# Patient Record
Sex: Female | Born: 1990
Health system: Southern US, Community
[De-identification: ages and names within clinical notes are randomized; demographics above are authoritative.]

## PROBLEM LIST (undated history)

## (undated) ENCOUNTER — Inpatient Hospital Stay (HOSPITAL_COMMUNITY): Payer: Self-pay

## (undated) DIAGNOSIS — O139 Gestational [pregnancy-induced] hypertension without significant proteinuria, unspecified trimester: Secondary | ICD-10-CM

## (undated) DIAGNOSIS — E876 Hypokalemia: Secondary | ICD-10-CM

## (undated) HISTORY — PX: KNEE SURGERY: SHX244

## (undated) HISTORY — DX: Gestational (pregnancy-induced) hypertension without significant proteinuria, unspecified trimester: O13.9

## (undated) HISTORY — PX: TONSILLECTOMY: SUR1361

## (undated) HISTORY — DX: Hypokalemia: E87.6

---

## 2012-10-21 LAB — OB RESULTS CONSOLE HEPATITIS B SURFACE ANTIGEN: Hepatitis B Surface Ag: NEGATIVE

## 2012-10-21 LAB — OB RESULTS CONSOLE RUBELLA ANTIBODY, IGM: Rubella: IMMUNE

## 2012-10-21 LAB — OB RESULTS CONSOLE ABO/RH: RH Type: POSITIVE

## 2012-10-21 LAB — OB RESULTS CONSOLE ANTIBODY SCREEN: Antibody Screen: NEGATIVE

## 2012-10-21 LAB — OB RESULTS CONSOLE HGB/HCT, BLOOD: Hemoglobin: 12.3 g/dL

## 2012-12-16 NOTE — L&D Delivery Note (Signed)
Delivery Note At 8:53 PM a viable female was delivered via Vaginal, Spontaneous Delivery (Presentation: ; Right Occiput Anterior).  APGAR: 7, 9; weight: not available at time of note  .   Placenta status: Intact, Spontaneous.  Cord: 3 vessels with the following complications: None.  Cord pH: not done  Anesthesia: Epidural  Episiotomy: n/a Lacerations: 1st degree Rt labial/periurethral Suture Repair: 4.0 monocryl Est. Blood Loss (mL): 300  Mom to AICU for 24hrs pp magnesium.  Baby to nursery-stable. Breastfeeding, undecided about contraception, OP circumcision.  Marge Duncans 05/01/2013, 9:28 PM

## 2012-12-16 NOTE — L&D Delivery Note (Signed)
Agree with above note.  Rikki Trosper H. 05/11/2013 11:07 AM

## 2013-02-08 LAB — GLUCOSE, 1 HOUR GESTATIONAL: Glucose, 1 hour: 119

## 2013-02-08 LAB — OB RESULTS CONSOLE HGB/HCT, BLOOD: Hemoglobin: 11.5 g/dL

## 2013-04-05 LAB — OB RESULTS CONSOLE GC/CHLAMYDIA: Chlamydia: NEGATIVE

## 2013-04-09 LAB — OB RESULTS CONSOLE GBS: GBS: POSITIVE

## 2013-04-26 ENCOUNTER — Inpatient Hospital Stay (HOSPITAL_COMMUNITY): Payer: Medicaid Other

## 2013-04-26 ENCOUNTER — Inpatient Hospital Stay (HOSPITAL_COMMUNITY)
Admission: AD | Admit: 2013-04-26 | Discharge: 2013-04-26 | Disposition: A | Discharge status: Home / Self Care | Payer: Medicaid Other | Source: Ambulatory Visit | Attending: Obstetrics & Gynecology | Admitting: Obstetrics & Gynecology

## 2013-04-26 ENCOUNTER — Encounter (HOSPITAL_COMMUNITY): Payer: Self-pay | Admitting: *Deleted

## 2013-04-26 DIAGNOSIS — O139 Gestational [pregnancy-induced] hypertension without significant proteinuria, unspecified trimester: Secondary | ICD-10-CM | POA: Insufficient documentation

## 2013-04-26 DIAGNOSIS — O368190 Decreased fetal movements, unspecified trimester, not applicable or unspecified: Secondary | ICD-10-CM | POA: Insufficient documentation

## 2013-04-26 DIAGNOSIS — O1203 Gestational edema, third trimester: Secondary | ICD-10-CM

## 2013-04-26 LAB — RAPID URINE DRUG SCREEN, HOSP PERFORMED
Barbiturates: NOT DETECTED
Benzodiazepines: NOT DETECTED
Cocaine: NOT DETECTED

## 2013-04-26 LAB — COMPREHENSIVE METABOLIC PANEL
BUN: 5 mg/dL — ABNORMAL LOW (ref 6–23)
CO2: 22 mEq/L (ref 19–32)
Chloride: 103 mEq/L (ref 96–112)
Creatinine, Ser: 0.68 mg/dL (ref 0.50–1.10)
GFR calc Af Amer: >90 mL/min (ref >90)
GFR calc non Af Amer: >90 mL/min (ref >90)
Glucose, Bld: 73 mg/dL (ref 70–99)
Total Bilirubin: 0.2 mg/dL — ABNORMAL LOW (ref 0.3–1.2)

## 2013-04-26 LAB — CBC
HCT: 33.7 % — ABNORMAL LOW (ref 36.0–46.0)
MCV: 88 fL (ref 78.0–100.0)
RDW: 12.9 % (ref 11.5–15.5)
WBC: 11.3 10*3/uL — ABNORMAL HIGH (ref 4.0–10.5)

## 2013-04-26 LAB — URINALYSIS, ROUTINE W REFLEX MICROSCOPIC
Bilirubin Urine: NEGATIVE
Hgb urine dipstick: NEGATIVE
Nitrite: NEGATIVE
Specific Gravity, Urine: <1.005 — ABNORMAL LOW (ref 1.005–1.030)
pH: 7 (ref 5.0–8.0)

## 2013-04-26 LAB — URINE MICROSCOPIC-ADD ON

## 2013-04-26 MED ORDER — POTASSIUM CHLORIDE CRYS ER 20 MEQ PO TBCR: 40.0000 meq | EXTENDED_RELEASE_TABLET | Freq: Once | ORAL | Status: DC

## 2013-04-26 MED ORDER — POTASSIUM CHLORIDE CRYS ER 20 MEQ PO TBCR
40.0000 meq | EXTENDED_RELEASE_TABLET | Freq: Once | ORAL | Status: AC
  Administered 2013-04-26: 40 meq via ORAL
  Filled 2013-04-26: qty 2

## 2013-04-26 NOTE — MAU Note (Signed)
Pt states she hasn't felt the baby move but two times today.  Pt called [facility] and spoke with Patty, RN and was advised by Dr. Allena Katz to come to the hospital since she was miserable.  Denies vaginal bleeding or ROM.

## 2013-04-26 NOTE — MAU Provider Note (Signed)
History     CSN: [id number]  Arrival date and time: 04/26/13 1807   First Provider Initiated Contact with Patient 04/26/13 1859      Chief Complaint  Patient presents with  . Decreased Fetal Movement   HPI This is a .22 y.o. female at [redacted]w[redacted]d who presents with c/o swelling and decreased fetal movement. Wants to change care to our clinic here because she did not like [facility] when she toured there. States has had high blood pressure  Most recent B/P at the office per records was 140/80  RN Note: Pt states she hasn't felt the baby move but two times today. Pt called [facility] and spoke with Patty, RN and was advised by Dr. Allena Katz to come to the hospital since she was miserable. Denies vaginal bleeding or ROM.      OB History   Grav Para Term Preterm Abortions TAB SAB Ect Mult Living   1         0      Past Medical History  Diagnosis Date  . Medical history non-contributory     Past Surgical History  Procedure Laterality Date  . Tonsillectomy    . Knee surgery      History reviewed. No pertinent family history.  History  Substance Use Topics  . Smoking status: Never Smoker   . Smokeless tobacco: Not on file  . Alcohol Use: No     Comment: stopped 1 1/2 months ago, would smoke 1-2 times a day    Allergies:  Allergies  Allergen Reactions  . Ultram (Tramadol)     Unknown reaction was rushed to ER when she took tramadol    Prescriptions prior to admission  Medication Sig Dispense Refill  . acetaminophen (TYLENOL) 325 MG tablet Take 650 mg by mouth daily as needed for pain.      . calcium carbonate (TUMS - DOSED IN MG ELEMENTAL CALCIUM) 500 MG chewable tablet Chew 1 tablet by mouth daily as needed for heartburn.      . Prenatal Vit-Fe Fumarate-FA (PRENATAL MULTIVITAMIN) TABS Take 1 tablet by mouth at bedtime.        Review of Systems  Constitutional: Positive for malaise/fatigue. Negative for fever and chills.  Eyes: Positive for blurred vision.   Gastrointestinal: Positive for abdominal pain. Negative for nausea, vomiting, diarrhea and constipation.  Genitourinary: Negative for dysuria.  Musculoskeletal: Positive for myalgias.  Neurological: Positive for weakness and headaches.   Physical Exam   Blood pressure 123/70, pulse 63, temperature 97.7 F (36.5 C), temperature source Oral, resp. rate 20, height 5\' 11"  (1.803 m), weight 260 lb 8 oz (118.162 kg), SpO2 100.00%. Filed Vitals:   04/26/13 1849 04/26/13 1900 04/26/13 1901 04/26/13 1921  BP: 149/85 145/90 145/96 123/70  Pulse: 74 84 79 63  Temp:      TempSrc:      Resp:      Height:      Weight:      SpO2:        Physical Exam  Constitutional: She is oriented to person, place, and time. She appears well-developed and well-nourished. No distress.  HENT:  Head: Normocephalic.  Neck: Normal range of motion. Neck supple.  Cardiovascular: Normal rate.   Respiratory: Effort normal.  GI: Soft. She exhibits no distension. There is no tenderness. There is no rebound and no guarding.  Genitourinary: Vagina normal and uterus normal. No vaginal discharge found.  Musculoskeletal: Normal range of motion. She exhibits edema (1-2+).  Neurological: She is alert and oriented to person, place, and time.  Skin: Skin is warm and dry.  Psychiatric: She has a normal mood and affect.  Fetal heart rate reactive Occasional mild contractions Dilation: 1 Effacement (%): 60 Station: -3 Presentation: Vertex Exam by:: Artelia Laroche, CNM  Previous exam 1cm MAU Course  Procedures Results for orders placed during the hospital encounter of 04/26/13 (from the past 24 hour(s))  URINALYSIS, ROUTINE W REFLEX MICROSCOPIC     Status: Abnormal   Collection Time    04/26/13  6:11 PM      Result Value Range   Color, Urine YELLOW  YELLOW   APPearance CLEAR  CLEAR   Specific Gravity, Urine <1.005 (*) 1.005 - 1.030   pH 7.0  5.0 - 8.0   Glucose, UA NEGATIVE  NEGATIVE mg/dL   Hgb urine dipstick  NEGATIVE  NEGATIVE   Bilirubin Urine NEGATIVE  NEGATIVE   Ketones, ur NEGATIVE  NEGATIVE mg/dL   Protein, ur NEGATIVE  NEGATIVE mg/dL   Urobilinogen, UA 0.2  0.0 - 1.0 mg/dL   Nitrite NEGATIVE  NEGATIVE   Leukocytes, UA TRACE (*) NEGATIVE  URINE RAPID DRUG SCREEN (HOSP PERFORMED)     Status: None   Collection Time    04/26/13  6:11 PM      Result Value Range   Opiates NONE DETECTED  NONE DETECTED   Cocaine NONE DETECTED  NONE DETECTED   Benzodiazepines NONE DETECTED  NONE DETECTED   Amphetamines NONE DETECTED  NONE DETECTED   Tetrahydrocannabinol NONE DETECTED  NONE DETECTED   Barbiturates NONE DETECTED  NONE DETECTED  PROTEIN / CREATININE RATIO, URINE     Status: Abnormal   Collection Time    04/26/13  6:11 PM      Result Value Range   Creatinine, Urine 29.33     Total Protein, Urine 4.6     PROTEIN CREATININE RATIO 0.16 (*) 0.00 - 0.15  URINE MICROSCOPIC-ADD ON     Status: Abnormal   Collection Time    04/26/13  6:11 PM      Result Value Range   Squamous Epithelial / LPF RARE  RARE   WBC, UA 0-2  <3 WBC/hpf   RBC / HPF 0-2  <3 RBC/hpf   Bacteria, UA FEW (*) RARE  CBC     Status: Abnormal   Collection Time    04/26/13  6:52 PM      Result Value Range   WBC 11.3 (*) 4.0 - 10.5 K/uL   RBC 3.83 (*) 3.87 - 5.11 MIL/uL   Hemoglobin 11.5 (*) 12.0 - 15.0 g/dL   HCT 16.1 (*) 09.6 - 04.5 %   MCV 88.0  78.0 - 100.0 fL   MCH 30.0  26.0 - 34.0 pg   MCHC 34.1  30.0 - 36.0 g/dL   RDW 40.9  81.1 - 91.4 %   Platelets 140 (*) 150 - 400 K/uL  COMPREHENSIVE METABOLIC PANEL     Status: Abnormal   Collection Time    04/26/13  6:52 PM      Result Value Range   Sodium 139  135 - 145 mEq/L   Potassium 2.7 (*) 3.5 - 5.1 mEq/L   Chloride 103  96 - 112 mEq/L   CO2 22  19 - 32 mEq/L   Glucose, Bld 73  70 - 99 mg/dL   BUN 5 (*) 6 - 23 mg/dL   Creatinine, Ser 7.82  0.50 - 1.10 mg/dL   Calcium  9.1  8.4 - 10.5 mg/dL   Total Protein 5.9 (*) 6.0 - 8.3 g/dL   Albumin 2.7 (*) 3.5 - 5.2 g/dL    AST 8  0 - 37 U/L   ALT 5  0 - 35 U/L   Alkaline Phosphatase 168 (*) 39 - 117 U/L   Total Bilirubin 0.2 (*) 0.3 - 1.2 mg/dL   GFR calc non Af Amer >90  >90 mL/min   GFR calc Af Amer >90  >90 mL/min   *RADIOLOGY REPORT*  Clinical Data: Decreased fetal movement.  LIMITED OBSTETRIC ULTRASOUND  Number of Fetuses: 1  Heart Rate: 134 bpm  Movement: Present  Breathing:  Presentation: Cephalic  Placental Location: Fundal, posterior.  Previa: No  Amniotic Fluid (Subjective): Normal  AFI 12.2 cm (5%ile = 7.3 cm; 95%ile = 23.9 cm)  FL: 7.55 cm 38 w 5 d  MATERNAL FINDINGS:  Cervix: N/A  Uterus/Adnexae: Right ovary not visualized. Left ovary normal  appearance. No adnexal masses.  IMPRESSION:  AFI within normal limits.  Fetal heart rate 134 beats.  Recommend followup with non-emergent complete OB 14+ wk US  examination for fetal biometric evaluation and anatomic survey if  not already performed.  Original Report Authenticated By: Charlett Nose, M.D.   2006: Spoke with Dr. Penne Lash: Plan to get an AFI, and send patient with a 24 hour urine. Have her bring the urine back on Wednesday. We will re-check b/p at that time. If it remains elevated we will plan for IOL at 39 weeks.   Assessment and Plan  A:  SIUP at [redacted]w[redacted]d       Gestational hypertension      PNC interruption      R/o preeclampsia P:  Will check PIH labs and Pr/Cr Ratio      Report to [facility] HoganCNM 24 hour urine FU Wednesday in the clinic for B/P check and to drop off 24 hour urine.      [facility] 04/26/2013, 7:52 PM   Tawnya Crook

## 2013-04-26 NOTE — MAU Note (Signed)
Critical lab value rec'd from lab Potassium 2.7.  Artelia Laroche CNM notified of critical value.

## 2013-04-26 NOTE — MAU Note (Signed)
PT   WAS CALLED TO COME BACK SO COULD START 24 HR URINE  TOMORROW AND TAKE TO CLINIC ON WED.  WHILE HERE - DR LEGGETT    WANTS AN U/S ALSO.

## 2013-04-28 ENCOUNTER — Encounter: Payer: Self-pay | Admitting: Advanced Practice Midwife

## 2013-04-28 ENCOUNTER — Ambulatory Visit (INDEPENDENT_AMBULATORY_CARE_PROVIDER_SITE_OTHER): Payer: Medicaid Other | Admitting: Advanced Practice Midwife

## 2013-04-28 VITALS — BP 134/88 | Temp 97.9°F | Wt 261.0 lb

## 2013-04-28 DIAGNOSIS — O139 Gestational [pregnancy-induced] hypertension without significant proteinuria, unspecified trimester: Secondary | ICD-10-CM

## 2013-04-28 DIAGNOSIS — O133 Gestational [pregnancy-induced] hypertension without significant proteinuria, third trimester: Secondary | ICD-10-CM

## 2013-04-28 LAB — POCT URINALYSIS DIP (DEVICE)
Hgb urine dipstick: NEGATIVE
Nitrite: NEGATIVE
Urobilinogen, UA: 0.2 mg/dL (ref 0.0–1.0)
pH: 6 (ref 5.0–8.0)

## 2013-04-28 NOTE — Addendum Note (Signed)
Addended by: Franchot Mimes on: 04/28/2013 01:38 PM   Modules accepted: Orders

## 2013-04-28 NOTE — Progress Notes (Signed)
NST reactive today. Cervix 1/50/-2. C/W Dr. Jolayne Panther. Will continue with plan for IOL on 5/16 2/2 GHTN.

## 2013-04-28 NOTE — Progress Notes (Signed)
Here for first visit. Transferring care from [facility] wants to deliver here, c/o edema" all over" that has gotten worse in last 2 weeks,pelvic pressure. Given new patient information. Brought 24 hour urine with her .

## 2013-04-29 ENCOUNTER — Telehealth (HOSPITAL_COMMUNITY): Payer: Self-pay | Admitting: *Deleted

## 2013-04-29 ENCOUNTER — Encounter (HOSPITAL_COMMUNITY): Payer: Self-pay | Admitting: *Deleted

## 2013-04-29 NOTE — MAU Provider Note (Signed)
Obtained records from [facility].  BP was not > 140/90 on other prenatal visits.  If pt is hypertensive again, would qualify as GHTN due to 2 elevated pressures >4 hrs apart.

## 2013-04-29 NOTE — Telephone Encounter (Signed)
Preadmission screen  

## 2013-04-30 ENCOUNTER — Encounter (HOSPITAL_COMMUNITY): Payer: Self-pay

## 2013-04-30 ENCOUNTER — Inpatient Hospital Stay (HOSPITAL_COMMUNITY)
Admission: RE | Admit: 2013-04-30 | Discharge: 2013-05-05 | DRG: 775 | Disposition: A | Discharge status: Home / Self Care | Payer: Medicaid Other | Source: Ambulatory Visit | Attending: Obstetrics & Gynecology | Admitting: Obstetrics & Gynecology

## 2013-04-30 DIAGNOSIS — O99892 Other specified diseases and conditions complicating childbirth: Secondary | ICD-10-CM | POA: Diagnosis present

## 2013-04-30 DIAGNOSIS — Z22330 Carrier of Group B streptococcus: Secondary | ICD-10-CM

## 2013-04-30 DIAGNOSIS — O09299 Supervision of pregnancy with other poor reproductive or obstetric history, unspecified trimester: Secondary | ICD-10-CM

## 2013-04-30 DIAGNOSIS — O139 Gestational [pregnancy-induced] hypertension without significant proteinuria, unspecified trimester: Principal | ICD-10-CM | POA: Diagnosis present

## 2013-04-30 LAB — COMPREHENSIVE METABOLIC PANEL
CO2: 22 mEq/L (ref 19–32)
Calcium: 8.9 mg/dL (ref 8.4–10.5)
Creatinine, Ser: 0.69 mg/dL (ref 0.50–1.10)
GFR calc Af Amer: >90 mL/min (ref >90)
GFR calc non Af Amer: >90 mL/min (ref >90)
Glucose, Bld: 81 mg/dL (ref 70–99)

## 2013-04-30 LAB — PROTEIN, URINE, 24 HOUR: Protein, Urine: <3 mg/dL

## 2013-04-30 LAB — CREATININE CLEARANCE, URINE, 24 HOUR
Creatinine, 24H Ur: 982 mg/d (ref 700–1800)
Creatinine: 0.68 mg/dL (ref 0.50–1.10)

## 2013-04-30 LAB — CBC
MCH: 30.1 pg (ref 26.0–34.0)
MCHC: 34.2 g/dL (ref 30.0–36.0)
Platelets: 137 10*3/uL — ABNORMAL LOW (ref 150–400)
RBC: 3.75 MIL/uL — ABNORMAL LOW (ref 3.87–5.11)

## 2013-04-30 LAB — TYPE AND SCREEN
ABO/RH(D): O POS
Antibody Screen: NEGATIVE

## 2013-04-30 MED ORDER — OXYCODONE-ACETAMINOPHEN 5-325 MG PO TABS: 1.0000 | ORAL_TABLET | ORAL | Status: DC | PRN

## 2013-04-30 MED ORDER — PENICILLIN G POTASSIUM 5000000 UNITS IJ SOLR
5.0000 10*6.[IU] | Freq: Once | INTRAVENOUS | Status: AC
  Administered 2013-04-30: 5 10*6.[IU] via INTRAVENOUS
  Filled 2013-04-30: qty 5

## 2013-04-30 MED ORDER — ONDANSETRON HCL 4 MG/2ML IJ SOLN
4.0000 mg | Freq: Four times a day (QID) | INTRAMUSCULAR | Status: DC | PRN
  Administered 2013-05-01: 4 mg via INTRAVENOUS
  Filled 2013-04-30: qty 2

## 2013-04-30 MED ORDER — TERBUTALINE SULFATE 1 MG/ML IJ SOLN: 0.2500 mg | Freq: Once | INTRAMUSCULAR | Status: AC | PRN

## 2013-04-30 MED ORDER — PENICILLIN G POTASSIUM 5000000 UNITS IJ SOLR
2.5000 10*6.[IU] | INTRAMUSCULAR | Status: DC
  Administered 2013-05-01 (×5): 2.5 10*6.[IU] via INTRAVENOUS
  Filled 2013-04-30 (×10): qty 2.5

## 2013-04-30 MED ORDER — MISOPROSTOL 25 MCG QUARTER TABLET
25.0000 ug | ORAL_TABLET | ORAL | Status: DC | PRN
  Administered 2013-04-30 – 2013-05-01 (×2): 25 ug via VAGINAL
  Filled 2013-04-30 (×2): qty 0.25

## 2013-04-30 MED ORDER — LIDOCAINE HCL (PF) 1 % IJ SOLN
30.0000 mL | INTRAMUSCULAR | Status: DC | PRN
  Filled 2013-04-30: qty 30

## 2013-04-30 MED ORDER — OXYTOCIN BOLUS FROM INFUSION: 500.0000 mL | INTRAVENOUS | Status: DC

## 2013-04-30 MED ORDER — OXYTOCIN 40 UNITS IN LACTATED RINGERS INFUSION - SIMPLE MED
62.5000 mL/h | INTRAVENOUS | Status: DC
  Administered 2013-05-01: 62.5 mL/h via INTRAVENOUS

## 2013-04-30 MED ORDER — NALBUPHINE SYRINGE 5 MG/0.5 ML
10.0000 mg | INJECTION | INTRAMUSCULAR | Status: DC | PRN
  Administered 2013-05-01 (×3): 10 mg via INTRAVENOUS
  Filled 2013-04-30: qty 0.5
  Filled 2013-04-30: qty 1
  Filled 2013-04-30: qty 0.5
  Filled 2013-04-30 (×2): qty 1

## 2013-04-30 MED ORDER — PENICILLIN G POTASSIUM 5000000 UNITS IJ SOLR
5.0000 10*6.[IU] | Freq: Once | INTRAVENOUS | Status: DC
  Filled 2013-04-30: qty 5

## 2013-04-30 MED ORDER — ACETAMINOPHEN 325 MG PO TABS
650.0000 mg | ORAL_TABLET | ORAL | Status: DC | PRN
  Administered 2013-04-30 – 2013-05-01 (×4): 650 mg via ORAL
  Filled 2013-04-30 (×4): qty 2

## 2013-04-30 MED ORDER — LACTATED RINGERS IV SOLN
INTRAVENOUS | Status: DC
  Administered 2013-04-30 – 2013-05-01 (×4): via INTRAVENOUS

## 2013-04-30 MED ORDER — CITRIC ACID-SODIUM CITRATE 334-500 MG/5ML PO SOLN
30.0000 mL | ORAL | Status: DC | PRN
  Administered 2013-04-30: 30 mL via ORAL
  Filled 2013-04-30: qty 15

## 2013-04-30 MED ORDER — LACTATED RINGERS IV SOLN: 500.0000 mL | INTRAVENOUS | Status: DC | PRN

## 2013-04-30 MED ORDER — IBUPROFEN 600 MG PO TABS: 600.0000 mg | ORAL_TABLET | Freq: Four times a day (QID) | ORAL | Status: DC | PRN

## 2013-04-30 NOTE — H&P (Signed)
Lori Mcmahon is a 22 y.o. female presenting for IOL for gestational hypertension. States she has had pressures in the 150s and 180s/90-100s. No headache, vision changes or RUQ pain today but has had a mild headache off and on over last week. Was getting care in [facility] but transferred care to high risk clinic for [facility]. Baby moving well. No ctx, fluid leaking or bleeding.  24-hour urine protein on 04/28/13 was 23 Last ultrasound on 04/26/13 showed fetus cephalic, normal AFI, posterior placenta.   History OB History   Grav Para Term Preterm Abortions TAB SAB Ect Mult Living   1         0     Past Medical History  Diagnosis Date  . Medical history non-contributory   . Pregnancy induced hypertension   . Low blood potassium    Past Surgical History  Procedure Laterality Date  . Tonsillectomy    . Knee surgery     Family History: family history includes Other in her mother. Social History:  reports that she has never smoked. She has never used smokeless tobacco. She reports that she uses illicit drugs (Marijuana). She reports that she does not drink alcohol.   Prenatal Transfer Tool  Maternal Diabetes: No Genetic Screening: Declined Maternal Ultrasounds/Referrals: Normal Fetal Ultrasounds or other Referrals:  None Maternal Substance Abuse:  No Significant Maternal Medications:  None Significant Maternal Lab Results:  Lab values include: Group B Strep positive Other Comments:  None  ROS  Pertinent pos and neg listed in HPI    There were no vitals taken for this visit.   Fetal Exam Fetal Monitor Review: Mode: ultrasound.   Baseline rate: 140.  Variability: moderate (6-25 bpm).   Pattern: accelerations present and no decelerations.    Fetal State Assessment: Category I - tracings are normal.     Physical Exam  Constitutional: She is oriented to person, place, and time. No distress.  Morbidly obese  HENT:  Head: Normocephalic and atraumatic.  Eyes: Conjunctivae and  EOM are normal.  Neck: Normal range of motion. Neck supple.  Cardiovascular: Normal rate.   Respiratory: Effort normal. No respiratory distress.  GI: Soft. There is no tenderness. There is no rebound and no guarding.  Musculoskeletal: She exhibits edema (mild lower extremity). She exhibits no tenderness.  Neurological: She is alert and oriented to person, place, and time.  Skin: Skin is warm and dry.  Psychiatric: She has a normal mood and affect.    Prenatal labs: ABO, Rh: O/Positive/-- (11/06 0000) Antibody: Negative (11/06 0000) Rubella: Immune (11/06 0000) RPR: Nonreactive (02/25 0000)  HBsAg: Negative (11/06 0000)  HIV: Non-reactive (02/25 0000)  GBS: Positive (04/25 0000)   Assessment/Plan: 22 y.o. G1P0 at [redacted]w[redacted]d presenting for induction of labor for gestational hypertension - Monitor BP, treat with labetalol IV for BP > 160/110, repeat PIH labs - Cytotec for cervical ripening - PCN for GBS prophylaxis - start when in active labor.  Napoleon Form 04/30/2013, 7:27 PM

## 2013-05-01 ENCOUNTER — Encounter (HOSPITAL_COMMUNITY): Payer: Self-pay | Admitting: Anesthesiology

## 2013-05-01 ENCOUNTER — Encounter (HOSPITAL_COMMUNITY): Payer: Self-pay

## 2013-05-01 ENCOUNTER — Inpatient Hospital Stay (HOSPITAL_COMMUNITY): Payer: Medicaid Other | Admitting: Anesthesiology

## 2013-05-01 DIAGNOSIS — O139 Gestational [pregnancy-induced] hypertension without significant proteinuria, unspecified trimester: Secondary | ICD-10-CM

## 2013-05-01 DIAGNOSIS — O9989 Other specified diseases and conditions complicating pregnancy, childbirth and the puerperium: Secondary | ICD-10-CM

## 2013-05-01 LAB — PROTEIN / CREATININE RATIO, URINE
Protein Creatinine Ratio: 0.18 — ABNORMAL HIGH (ref 0.00–0.15)
Total Protein, Urine: 7.5 mg/dL

## 2013-05-01 LAB — CBC
MCV: 87.8 fL (ref 78.0–100.0)
Platelets: 127 10*3/uL — ABNORMAL LOW (ref 150–400)
RBC: 4.02 MIL/uL (ref 3.87–5.11)
RDW: 12.9 % (ref 11.5–15.5)
WBC: 11.7 10*3/uL — ABNORMAL HIGH (ref 4.0–10.5)

## 2013-05-01 LAB — ABO/RH: ABO/RH(D): O POS

## 2013-05-01 LAB — RPR: RPR Ser Ql: NONREACTIVE

## 2013-05-01 MED ORDER — OXYCODONE-ACETAMINOPHEN 5-325 MG PO TABS
1.0000 | ORAL_TABLET | ORAL | Status: DC | PRN
  Administered 2013-05-01 – 2013-05-03 (×9): 2 via ORAL
  Administered 2013-05-04 (×2): 1 via ORAL
  Administered 2013-05-04 (×3): 2 via ORAL
  Administered 2013-05-04 (×2): 1 via ORAL
  Administered 2013-05-05 (×3): 2 via ORAL
  Filled 2013-05-01 (×5): qty 2
  Filled 2013-05-01 (×2): qty 1
  Filled 2013-05-01 (×2): qty 2
  Filled 2013-05-01: qty 1
  Filled 2013-05-01 (×6): qty 2
  Filled 2013-05-01: qty 1
  Filled 2013-05-01 (×2): qty 2

## 2013-05-01 MED ORDER — SODIUM CHLORIDE 0.9 % IJ SOLN
3.0000 mL | Freq: Two times a day (BID) | INTRAMUSCULAR | Status: DC
  Administered 2013-05-03 – 2013-05-04 (×3): 3 mL via INTRAVENOUS

## 2013-05-01 MED ORDER — SIMETHICONE 80 MG PO CHEW: 80.0000 mg | CHEWABLE_TABLET | ORAL | Status: DC | PRN

## 2013-05-01 MED ORDER — BENZOCAINE-MENTHOL 20-0.5 % EX AERO
1.0000 "application " | INHALATION_SPRAY | CUTANEOUS | Status: DC | PRN
  Filled 2013-05-01: qty 56

## 2013-05-01 MED ORDER — OXYTOCIN 40 UNITS IN LACTATED RINGERS INFUSION - SIMPLE MED
1.0000 m[IU]/min | INTRAVENOUS | Status: DC
  Administered 2013-05-01: 2 m[IU]/min via INTRAVENOUS
  Administered 2013-05-01: 10 m[IU]/min via INTRAVENOUS
  Administered 2013-05-01: 6 m[IU]/min via INTRAVENOUS
  Filled 2013-05-01: qty 1000

## 2013-05-01 MED ORDER — IBUPROFEN 600 MG PO TABS
600.0000 mg | ORAL_TABLET | Freq: Four times a day (QID) | ORAL | Status: DC
  Administered 2013-05-02 – 2013-05-05 (×15): 600 mg via ORAL
  Filled 2013-05-01 (×15): qty 1

## 2013-05-01 MED ORDER — FENTANYL 2.5 MCG/ML BUPIVACAINE 1/10 % EPIDURAL INFUSION (WH - ANES)
14.0000 mL/h | INTRAMUSCULAR | Status: DC | PRN
  Administered 2013-05-01: 14 mL/h via EPIDURAL
  Filled 2013-05-01 (×2): qty 125

## 2013-05-01 MED ORDER — MAGNESIUM SULFATE 40 G IN LACTATED RINGERS - SIMPLE
2.0000 g/h | INTRAVENOUS | Status: DC
  Administered 2013-05-01: 2 g/h via INTRAVENOUS
  Filled 2013-05-01: qty 500

## 2013-05-01 MED ORDER — DIPHENHYDRAMINE HCL 50 MG/ML IJ SOLN: 12.5000 mg | INTRAMUSCULAR | Status: DC | PRN

## 2013-05-01 MED ORDER — WITCH HAZEL-GLYCERIN EX PADS: 1.0000 "application " | MEDICATED_PAD | CUTANEOUS | Status: DC | PRN

## 2013-05-01 MED ORDER — OXYTOCIN 40 UNITS IN LACTATED RINGERS INFUSION - SIMPLE MED: 62.5000 mL/h | INTRAVENOUS | Status: DC | PRN

## 2013-05-01 MED ORDER — PRENATAL MULTIVITAMIN CH
1.0000 | ORAL_TABLET | Freq: Every day | ORAL | Status: DC
  Administered 2013-05-02 – 2013-05-05 (×4): 1 via ORAL
  Filled 2013-05-01 (×4): qty 1

## 2013-05-01 MED ORDER — LIDOCAINE HCL (PF) 1 % IJ SOLN
INTRAMUSCULAR | Status: DC | PRN
  Administered 2013-05-01 (×2): 4 mL

## 2013-05-01 MED ORDER — LANOLIN HYDROUS EX OINT: TOPICAL_OINTMENT | CUTANEOUS | Status: DC | PRN

## 2013-05-01 MED ORDER — TETANUS-DIPHTH-ACELL PERTUSSIS 5-2.5-18.5 LF-MCG/0.5 IM SUSP
0.5000 mL | Freq: Once | INTRAMUSCULAR | Status: DC
  Filled 2013-05-01: qty 0.5

## 2013-05-01 MED ORDER — ONDANSETRON HCL 4 MG PO TABS: 4.0000 mg | ORAL_TABLET | ORAL | Status: DC | PRN

## 2013-05-01 MED ORDER — LABETALOL HCL 5 MG/ML IV SOLN
20.0000 mg | INTRAVENOUS | Status: DC | PRN
  Administered 2013-05-01 (×3): 20 mg via INTRAVENOUS
  Filled 2013-05-01 (×3): qty 4

## 2013-05-01 MED ORDER — ZOLPIDEM TARTRATE 5 MG PO TABS: 5.0000 mg | ORAL_TABLET | Freq: Every evening | ORAL | Status: DC | PRN

## 2013-05-01 MED ORDER — TERBUTALINE SULFATE 1 MG/ML IJ SOLN: 0.2500 mg | Freq: Once | INTRAMUSCULAR | Status: DC | PRN

## 2013-05-01 MED ORDER — LACTATED RINGERS IV SOLN: 500.0000 mL | Freq: Once | INTRAVENOUS | Status: DC

## 2013-05-01 MED ORDER — ONDANSETRON HCL 4 MG/2ML IJ SOLN: 4.0000 mg | INTRAMUSCULAR | Status: DC | PRN

## 2013-05-01 MED ORDER — LABETALOL HCL 5 MG/ML IV SOLN: 10.0000 mg | INTRAVENOUS | Status: DC | PRN

## 2013-05-01 MED ORDER — EPHEDRINE 5 MG/ML INJ: 10.0000 mg | INTRAVENOUS | Status: DC | PRN

## 2013-05-01 MED ORDER — MEASLES, MUMPS & RUBELLA VAC ~~LOC~~ INJ
0.5000 mL | INJECTION | Freq: Once | SUBCUTANEOUS | Status: DC
  Filled 2013-05-01: qty 0.5

## 2013-05-01 MED ORDER — FLEET ENEMA 7-19 GM/118ML RE ENEM: 1.0000 | ENEMA | Freq: Every day | RECTAL | Status: DC | PRN

## 2013-05-01 MED ORDER — SENNOSIDES-DOCUSATE SODIUM 8.6-50 MG PO TABS
2.0000 | ORAL_TABLET | Freq: Every day | ORAL | Status: DC
  Administered 2013-05-01 – 2013-05-03 (×3): 2 via ORAL

## 2013-05-01 MED ORDER — FENTANYL 2.5 MCG/ML BUPIVACAINE 1/10 % EPIDURAL INFUSION (WH - ANES): 14.0000 mL/h | INTRAMUSCULAR | Status: DC | PRN

## 2013-05-01 MED ORDER — MAGNESIUM SULFATE 40 MG/ML IJ SOLN
4.0000 g | Freq: Once | INTRAMUSCULAR | Status: AC
  Administered 2013-05-01: 4 g via INTRAVENOUS
  Filled 2013-05-01: qty 100

## 2013-05-01 MED ORDER — SODIUM CHLORIDE 0.9 % IJ SOLN: 3.0000 mL | INTRAMUSCULAR | Status: DC | PRN

## 2013-05-01 MED ORDER — SODIUM CHLORIDE 0.9 % IV SOLN: 250.0000 mL | INTRAVENOUS | Status: DC | PRN

## 2013-05-01 MED ORDER — EPHEDRINE 5 MG/ML INJ
10.0000 mg | INTRAVENOUS | Status: DC | PRN
  Filled 2013-05-01: qty 4

## 2013-05-01 MED ORDER — BISACODYL 10 MG RE SUPP
10.0000 mg | Freq: Every day | RECTAL | Status: DC | PRN
  Administered 2013-05-04: 10 mg via RECTAL
  Filled 2013-05-01 (×2): qty 1

## 2013-05-01 MED ORDER — PHENYLEPHRINE 40 MCG/ML (10ML) SYRINGE FOR IV PUSH (FOR BLOOD PRESSURE SUPPORT): 80.0000 ug | PREFILLED_SYRINGE | INTRAVENOUS | Status: DC | PRN

## 2013-05-01 MED ORDER — MAGNESIUM SULFATE 40 G IN LACTATED RINGERS - SIMPLE
2.0000 g/h | INTRAVENOUS | Status: AC
  Administered 2013-05-02: 2 g/h via INTRAVENOUS
  Filled 2013-05-01 (×2): qty 500

## 2013-05-01 MED ORDER — PHENYLEPHRINE 40 MCG/ML (10ML) SYRINGE FOR IV PUSH (FOR BLOOD PRESSURE SUPPORT)
80.0000 ug | PREFILLED_SYRINGE | INTRAVENOUS | Status: DC | PRN
  Filled 2013-05-01: qty 5

## 2013-05-01 MED ORDER — FENTANYL 2.5 MCG/ML BUPIVACAINE 1/10 % EPIDURAL INFUSION (WH - ANES)
INTRAMUSCULAR | Status: DC | PRN
  Administered 2013-05-01: 14 mL/h via EPIDURAL

## 2013-05-01 MED ORDER — DIBUCAINE 1 % RE OINT
1.0000 "application " | TOPICAL_OINTMENT | RECTAL | Status: DC | PRN
  Filled 2013-05-01: qty 28

## 2013-05-01 MED ORDER — DIPHENHYDRAMINE HCL 25 MG PO CAPS: 25.0000 mg | ORAL_CAPSULE | Freq: Four times a day (QID) | ORAL | Status: DC | PRN

## 2013-05-01 NOTE — Progress Notes (Signed)
Lori Mcmahon is a 22 y.o. G1P0 at [redacted]w[redacted]d   Subjective: Breathing with ctx; receiving dose of Fentanyl  Objective: BP 176/104  Pulse 72  Temp(Src) 97.8 F (36.6 C) (Oral)  Resp 18  Ht 5\' 11"  (1.803 m)  Wt 261 lb (118.389 kg)  BMI 36.42 kg/m2 BP at this time 161/89      FHT:  FHR: 120 bpm, variability: moderate,  accelerations:  Present,  decelerations:  Absent UC:   regular, every 3-4 minutes SVE:   Dilation: 2 Effacement (%): 60 Station: -2 Exam by:: ALRinehart RN foley bulb placed without difficulty  Labs: Lab Results  Component Value Date   WBC 12.4* 04/30/2013   HGB 11.3* 04/30/2013   HCT 33.0* 04/30/2013   MCV 88.0 04/30/2013   PLT 137* 04/30/2013    Assessment / Plan: IUP at 39.1wks GHTN Unfavorable cx  Will leave foley in place until it comes out spontaneously Watch BP and tx as needed  Cam Hai 05/01/2013, 8:43 AM

## 2013-05-01 NOTE — Anesthesia Procedure Notes (Signed)
Epidural Patient location during procedure: OB Start time: 05/01/2013 11:00 AM End time: 05/01/2013 11:20 AM  Staffing Anesthesiologist: Lewie Loron R Performed by: anesthesiologist   Preanesthetic Checklist Completed: patient identified, pre-op evaluation, timeout performed, IV checked, risks and benefits discussed and monitors and equipment checked  Epidural Patient position: sitting Prep: DuraPrep Patient monitoring: heart rate, continuous pulse ox and blood pressure Approach: midline Injection technique: LOR air and LOR saline  Needle:  Needle type: Tuohy  Needle gauge: 17 G Needle length: 9 cm Needle insertion depth: 9 cm Catheter type: closed end flexible Catheter size: 19 Gauge Catheter at skin depth: 15 cm Test dose: negative  Assessment Sensory level: T8 Events: blood not aspirated, injection not painful, no injection resistance, negative IV test and no paresthesia

## 2013-05-01 NOTE — Progress Notes (Signed)
Lucette Kratz is a 22 y.o. G1P0 at [redacted]w[redacted]d admitted for induction of labor due to [facility] w/ severe range bp's.  Subjective: Comfortable w/ epidural, no complaints. Denies ha, scotomata, ruq/epigastric pain, n/v.    Objective: BP 142/81  Pulse 74  Temp(Src) 97.8 F (36.6 C) (Oral)  Resp 18  Ht 5\' 11"  (1.803 m)  Wt 118.389 kg (261 lb)  BMI 36.42 kg/m2  SpO2 98% I/O last 3 completed shifts: In: 977.1 [I.V.:977.1] Out: -  Total I/O In: 2840.1 [P.O.:650; I.V.:1440.1; Other:350; IV Piggyback:400] Out: 1350 [Urine:1350]  FHT:  FHR: 125 bpm, variability: moderate,  accelerations:  Present,  decelerations:  Absent UC:   irregular, every 2-4 minutes SVE:   Dilation: 6 Effacement (%): 90 Station: -1 Exam by:: Jakeia Carreras AROM small amount of light MSF  Labs: Lab Results  Component Value Date   WBC 11.7* 05/01/2013   HGB 11.9* 05/01/2013   HCT 35.3* 05/01/2013   MCV 87.8 05/01/2013   PLT 127* 05/01/2013    Assessment / Plan: IOL d/t GHTN, previously w/ severe range bp's- none severe range since epidural placement.  Pitocin at 28mu/min, now arom'd  Labor: Progressing normally Preeclampsia:  on magnesium sulfate and no signs or symptoms of toxicity Fetal Wellbeing:  Category I Pain Control:  Epidural I/D:  PCN for gbs pos Anticipated MOD:  NSVD  Marge Duncans 05/01/2013, 6:26 PM

## 2013-05-01 NOTE — Progress Notes (Signed)
Sameka Bagent is a 22 y.o. G1P0 at [redacted]w[redacted]d by admitted for induction of labor due to [facility].  Subjective: Uncomfortable w/ uc's- has received multiple doses of iv pain meds, now requesting epidural. Denies ha, scotomata, ruq/epigastric pain, n/v.    Objective: BP 175/89  Pulse 62  Temp(Src) 97.8 F (36.6 C) (Oral)  Resp 18  Ht 5\' 11"  (1.803 m)  Wt 118.389 kg (261 lb)  BMI 36.42 kg/m2     BPs 145-179/81-111 w/ 7 of last 9 bp's severe range. Has not received any antihypertensives  FHT:  FHR: 115 bpm, variability: moderate,  accelerations:  Present,  decelerations:  Absent UC:   irregular, every 2-5 minutes SVE:   2/60/-2 @ time of foley bulb placement @ 0800 DTRs 1+, BLE 2+, no clonus  Labs: Lab Results  Component Value Date   WBC 12.4* 04/30/2013   HGB 11.3* 04/30/2013   HCT 33.0* 04/30/2013   MCV 88.0 04/30/2013   PLT 137* 04/30/2013    Assessment / Plan: IOL d/t GHTN, now w/ severe range bp's- bp's reviewed w/ Dr.Harraway-Smith, will begin Magnesium per protocol Labetalol IV per protocol as needed, RN to notify me if not helping or max of 300mg  reached Cervical foley bulb still in, will begin pitocin per protocol once it falls out  Labor: cervical ripening phase Preeclampsia:  no s/s, now w/ severe range bp's Fetal Wellbeing:  Category I Pain Control:  IV pain meds, now requesting epidural I/D:  will begin PCN per protocol w/ active labor Anticipated MOD:  NSVD  Marge Duncans 05/01/2013, 9:22 AM

## 2013-05-01 NOTE — Anesthesia Preprocedure Evaluation (Signed)
Anesthesia Evaluation  Patient identified by MRN, date of birth, ID band Patient awake    Reviewed: Allergy & Precautions, H&P , NPO status , Patient's Chart, lab work & pertinent test results  Airway Mallampati: II TM Distance: >3 FB Neck ROM: Full    Dental  (+) Dental Advisory Given   Pulmonary neg pulmonary ROS,          Cardiovascular hypertension,     Neuro/Psych negative neurological ROS  negative psych ROS   GI/Hepatic negative GI ROS, Neg liver ROS,   Endo/Other  negative endocrine ROS  Renal/GU negative Renal ROS     Musculoskeletal negative musculoskeletal ROS (+)   Abdominal (+) + obese,   Peds  Hematology negative hematology ROS (+)   Anesthesia Other Findings   Reproductive/Obstetrics (+) Pregnancy                           Anesthesia Physical Anesthesia Plan  ASA: II  Anesthesia Plan: Epidural   Post-op Pain Management:    Induction:   Airway Management Planned:   Additional Equipment:   Intra-op Plan:   Post-operative Plan:   Informed Consent: I have reviewed the patients History and Physical, chart, labs and discussed the procedure including the risks, benefits and alternatives for the proposed anesthesia with the patient or authorized representative who has indicated his/her understanding and acceptance.     Plan Discussed with:   Anesthesia Plan Comments:         Anesthesia Quick Evaluation

## 2013-05-02 MED ORDER — LACTATED RINGERS IV SOLN
INTRAVENOUS | Status: DC
  Administered 2013-05-02 (×2): via INTRAVENOUS

## 2013-05-02 NOTE — Progress Notes (Signed)
Post Partum Day 1 Subjective: Eating, drinking, voiding, ambulating well.  No flatus.  Lochia and pain wnl.  Denies dizziness, lightheadedness, sob. No complaints.  Denies ha, scotomata, ruq/epigastric pain, n/v.    Objective: Blood pressure 127/69, pulse 67, temperature 98 F (36.7 C), temperature source Oral, resp. rate 18, height 5\' 11"  (1.803 m), weight 118.389 kg (261 lb), SpO2 100.00%, unknown if currently breastfeeding.  Physical Exam:  General: alert, cooperative and no distress Lochia: appropriate Uterine Fundus: firm Incision: n/a DVT Evaluation: No evidence of DVT seen on physical exam. Negative Homan's sign. No cords or calf tenderness. No significant calf/ankle edema.   Recent Labs  04/30/13 2014 05/01/13 0919  HGB 11.3* 11.9*  HCT 33.0* 35.3*    Assessment/Plan: Plan for discharge tomorrow, Breastfeeding and Lactation consult, condoms for contraception. OP circumcision already scheduled. Still on BS, getting moved to [facility] room shortly.  Magnesium due to be d/c'd at app 2100.     LOS: 2 days   Marge Duncans 05/02/2013, 7:53 AM

## 2013-05-02 NOTE — Progress Notes (Signed)
Patient received from labor and delivery in stable condition into room 371 with magnesium already infusing.

## 2013-05-02 NOTE — Anesthesia Postprocedure Evaluation (Signed)
Anesthesia Post Note  Patient: Lori Mcmahon  Procedure(s) Performed: * No procedures listed *  Anesthesia type: Epidural  Patient location: Mother/Baby  Post pain: Pain level controlled  Post assessment: Post-op Vital signs reviewed  Last Vitals:  Filed Vitals:   05/02/13 1710  BP: 153/94  Pulse: 92  Temp:   Resp: 18    Post vital signs: Reviewed  Level of consciousness:alert  Complications: No apparent anesthesia complications

## 2013-05-03 NOTE — Progress Notes (Signed)
Post Partum Day 2 Subjective: no complaints, up ad lib, voiding and tolerating PO  Objective: Blood pressure 140/73, pulse 76, temperature 98.2 F (36.8 C), temperature source Oral, resp. rate 16, height 5\' 11"  (1.803 m), weight 258 lb 6 oz (117.198 kg), SpO2 98.00%, unknown if currently breastfeeding. Filed Vitals:   05/03/13 0400 05/03/13 0500 05/03/13 0600 05/03/13 0700  BP:   134/60 140/73  Pulse: 78 89 74 76  Temp: 98.1 F (36.7 C)  98.2 F (36.8 C)   TempSrc: Oral  Oral   Resp: 14 16 16    Height:      Weight:   258 lb 6 oz (117.198 kg)   SpO2: 100% 100% 99% 98%    Physical Exam:  General: alert, cooperative and no distress Lochia: appropriate Uterine Fundus: firm Incision: na DVT Evaluation: No evidence of DVT seen on physical exam.   Recent Labs  04/30/13 2014 05/01/13 0919  HGB 11.3* 11.9*  HCT 33.0* 35.3*    Assessment/Plan: Plan for discharge tomorrow and Breastfeeding   LOS: 3 days   Jacere Pangborn H 05/03/2013, 7:28 AM

## 2013-05-03 NOTE — Clinical Social Work Maternal (Signed)
    Clinical Social Work Department PSYCHOSOCIAL ASSESSMENT - MATERNAL/CHILD 05/03/2013  Patient:  Lori Mcmahon, Lori Mcmahon  Account Number:  [id number]  Admit Date:  04/30/2013  Marjo Bicker Name:   Eveline Keto    Clinical Social Worker:  Nobie Putnam, LCSW   Date/Time:  05/03/2013 01:14 PM  Date Referred:  05/03/2013   Referral source  CN     Referred reason  Substance Abuse   Other referral source:    I:  FAMILY / HOME ENVIRONMENT Child's legal guardian:  PARENT  Guardian - Name Guardian - Age Guardian - Address  Lori Mcmahon [address]; [place], [place] [address]  [address] (same as above)   Other household support members/support persons Other support:   Lori Mcmahon, pt's aunt    II  PSYCHOSOCIAL DATA Information Source:  Patient Interview  Event organiser Employment:   Surveyor, quantity resources:  [employer] If Medicaid - County:  [place] Other  Sales executive  WIC   School / Grade:   Maternity Care Coordinator / Child Services Coordination / Early Interventions:  Cultural issues impacting care:    III  STRENGTHS Strengths  Adequate Resources  Home prepared for Child (including basic supplies)  Supportive family/friends   Strength comment:    IV  RISK FACTORS AND CURRENT PROBLEMS Current Problem:  YES   Risk Factor & Current Problem Patient Issue Family Issue Risk Factor / Current Problem Comment  Substance Abuse Y N Hx of MJ use    V  SOCIAL WORK ASSESSMENT CSW referral received to assess history of MJ use.  Pt admits to smoking MJ "everyday," prior to pregnancy confirmation at 6 weeks.  Once pregnancy was confirmed, she stopped for a while but started smoking again, since it helped increase her appetite.  She stopped smoking 1 1/2 months ago.  She denies other illegal substance use & verbalized understanding of hospital drug testing policy. UDS is negative, meconium results are pending.  She has all the necessary supplies for the  infant & good family support.  FOB was at the bedside, attending to infant.  CSW will monitor drug screen results & make a referral if needed.      VI SOCIAL WORK PLAN Social Work Plan  No Further Intervention Required / No Barriers to Discharge   Type of pt/family education:   If child protective services report - county:   If child protective services report - date:   Information/referral to community resources comment:   Other social work plan:

## 2013-05-03 NOTE — Plan of Care (Signed)
Problem: Phase I Progression Outcomes Goal: Pain controlled with appropriate interventions Outcome: Completed/Met Date Met:  05/03/13 Good pain control on po Percocet and Motrin Goal: Voiding adequately Outcome: Completed/Met Date Met:  05/03/13 Voiding large amts of clear yellow urine Goal: OOB as tolerated unless otherwise ordered Outcome: Completed/Met Date Met:  05/03/13 Walks around in room and to bathroom and tolerates well Goal: VS, stable, temp < 100.4 degrees F Outcome: Progressing Blood pressures stable on Magnesium drip Goal: Initial discharge plan identified Outcome: Completed/Met Date Met:  05/03/13 VSS Pain controlled on po medication Patient diuresing well Understands self care Understands when to call the MD Understands infant care    Goal: Other Phase I Outcomes/Goals Outcome: Progressing Bp control off of Magnesium drip  Problem: Phase II Progression Outcomes Goal: Progress activity as tolerated unless otherwise ordered Outcome: Completed/Met Date Met:  05/03/13 Tolerates ambulating well Goal: Tolerating diet Outcome: Completed/Met Date Met:  05/03/13 Tolerates Regular diet well

## 2013-05-04 MED ORDER — ENALAPRIL MALEATE 5 MG PO TABS
5.0000 mg | ORAL_TABLET | Freq: Every day | ORAL | Status: DC
  Administered 2013-05-04 – 2013-05-05 (×2): 5 mg via ORAL
  Filled 2013-05-04 (×3): qty 1

## 2013-05-04 MED ORDER — HYDROCHLOROTHIAZIDE 25 MG PO TABS
25.0000 mg | ORAL_TABLET | Freq: Every day | ORAL | Status: DC
  Administered 2013-05-04 – 2013-05-05 (×2): 25 mg via ORAL
  Filled 2013-05-04 (×3): qty 1

## 2013-05-04 MED ORDER — HYDROCHLOROTHIAZIDE 25 MG PO TABS
25.0000 mg | ORAL_TABLET | Freq: Every day | ORAL | Status: DC
  Filled 2013-05-04: qty 1

## 2013-05-04 NOTE — Progress Notes (Signed)
I was present for the exam and agree with above. Pt complaining of significant pedal edema. Will add HCTZ.   Rozel, CNM 05/04/2013 10:32 AM

## 2013-05-04 NOTE — Progress Notes (Signed)
Post Partum Day 3 Subjective: no complaints, up ad lib, voiding and tolerating PO  Objective: Blood pressure 169/91, pulse 79, temperature 97.8 F (36.6 C), temperature source Oral, resp. rate 18, height 5\' 11"  (1.803 m), weight 117.198 kg (258 lb 6 oz), SpO2 96.00%, unknown if currently breastfeeding.  Physical Exam:  General: alert, cooperative and no distress Lochia: appropriate Uterine Fundus: firm Incision:N/A DVT Evaluation: No evidence of DVT seen on physical exam. No cords or calf tenderness. 1+ LE edema   Recent Labs  05/01/13 0919  HGB 11.9*  HCT 35.3*    Assessment/Plan: BP elevated this am - 169/91.  Will start Enalapril 5 mg daily.  Will add additional agents if needed.   If BP improves will D/C home this afternoon with close follow up.    LOS: 4 days   Everlene Other 05/04/2013, 7:40 AM

## 2013-05-05 ENCOUNTER — Other Ambulatory Visit: Payer: Self-pay | Admitting: Obstetrics and Gynecology

## 2013-05-05 DIAGNOSIS — O09299 Supervision of pregnancy with other poor reproductive or obstetric history, unspecified trimester: Secondary | ICD-10-CM

## 2013-05-05 MED ORDER — HYDROCHLOROTHIAZIDE 25 MG PO TABS: 25.0000 mg | ORAL_TABLET | Freq: Every day | ORAL | Status: DC

## 2013-05-05 MED ORDER — ENALAPRIL MALEATE 5 MG PO TABS: 5.0000 mg | ORAL_TABLET | Freq: Every day | ORAL | Status: DC

## 2013-05-05 NOTE — Lactation Note (Signed)
This note was copied from the chart of Lori Boy Jannelle Notaro. Lactation Consultation Note Mom states br feeding is going very well; denies pain; denies questions or concerns. Enc mom to call the lactation office if she has any concerns, and to attend the BFSG.  Patient Name: Lori Mcmahon [id number] Date: 05/05/2013     Maternal Data    Feeding Feeding Type: Breast Milk Feeding method: Breast Length of feed:  (sleepy; no cues)  LATCH Score/Interventions Latch: Grasps breast easily, tongue down, lips flanged, rhythmical sucking. Intervention(s): Breast compression  Audible Swallowing: A few with stimulation  Type of Nipple: Everted at rest and after stimulation Intervention(s): Hand pump  Comfort (Breast/Nipple): Filling, red/small blisters or bruises, mild/mod discomfort     Hold (Positioning): No assistance needed to correctly position infant at breast.  LATCH Score: 8  Lactation Tools Discussed/Used     Consult Status      Lenard Forth 05/05/2013, 2:42 PM

## 2013-05-05 NOTE — Discharge Summary (Signed)
Obstetric Discharge Summary Reason for Admission: observation/evaluation gestational hypertension Prenatal Procedures: none Intrapartum Procedures: spontaneous vaginal delivery and laceration repair Postpartum Procedures: magnesium sulfate 24 hrs Complications-Operative and Postpartum: hypertension Hemoglobin  Date Value Range Status  05/01/2013 11.9* 12.0 - 15.0 g/dL Final  1/61/0960 45.4   Final     HCT  Date Value Range Status  05/01/2013 35.3* 36.0 - 46.0 % Final    Physical Exam:  General: alert, cooperative and no distress Lochia: appropriate Uterine Fundus: firm DVT Evaluation: No evidence of DVT seen on physical exam.  Discharge Diagnoses: Term Pregnancy-delivered, Preelampsia and hypertension  Discharge Information: Date: 05/05/2013 Activity: pelvic rest Diet: routine Medications: HCTZ and enalapril Condition: stable Instructions: hypertension Discharge to: home BP check 5 days  Newborn Data: Live born female  Birth Weight: 7 lb 5.1 oz (3320 g) APGAR: 7, 9  Home with mother.  ARNOLD,JAMES 05/05/2013, 12:29 PM

## 2013-05-05 NOTE — Progress Notes (Signed)
UR chart review completed.  

## 2013-05-05 NOTE — Progress Notes (Signed)
Post Partum Day #4 Subjective: Lori Mcmahon is a 22 y.o. female G1P1001 who had a SVD.  up ad lib, voiding, tolerating PO and + flatus.  She is still having some back pains rated as high as 8/10, but percocet has been helping with this.  She denies having headaches, dizziness on standing, or vision changes.  She states that her PIH started about 4 weeks prior to delivery and that her mother also had the condition.  She is breastfeeding exclusively.  She is not planning on using birth control as her and her husband are hoping to get pregnant again soon.  She has seen [facility] for her care, but is hoping to be seen here in the future.    Objective: Blood pressure 160/90, pulse 83, temperature 97.7 F (36.5 C), temperature source Oral, resp. rate 19, height 5\' 11"  (1.803 m), weight 117.198 kg (258 lb 6 oz), SpO2 98.00%, unknown if currently breastfeeding.  Physical Exam:  General: alert, cooperative and no distress CVS: RRR, good S1 and S2; no RGM  Lungs: CTA bilateral; no wheeze, rales, or rhonchi  Abd: normoactive bowel sounds x4, soft and non-tender, non-distended.  Lochia: appropriate Uterine Fundus: firm DVT Evaluation: No evidence of DVT seen on physical exam.  Negative Homan's sign.  Calf/Ankle edema is present.  No results found for this basename: HGB, HCT,  in the last 72 hours  Assessment/Plan: Patient is requesting discharge home given that her BP is improving and that she thinks being released from the hospital will help her condition.  We will consider this request while monitoring her today and consider adding an additional agent to better manage her BP.  Regardless, we will keep her on close follow up to monitor her condition.     LOS: 5 days   Anna Genre 05/05/2013, 8:10 AM

## 2013-05-07 ENCOUNTER — Ambulatory Visit: Payer: Medicaid Other

## 2013-06-03 ENCOUNTER — Ambulatory Visit: Payer: Medicaid Other | Admitting: Obstetrics and Gynecology

## 2014-10-17 ENCOUNTER — Encounter (HOSPITAL_COMMUNITY): Payer: Self-pay

## 2015-11-20 ENCOUNTER — Other Ambulatory Visit: Payer: Self-pay | Admitting: Obstetrics and Gynecology

## 2015-11-20 ENCOUNTER — Encounter: Payer: Self-pay | Admitting: Obstetrics and Gynecology

## 2015-11-28 ENCOUNTER — Encounter (HOSPITAL_COMMUNITY): Payer: Self-pay | Admitting: Obstetrics and Gynecology

## 2015-11-29 ENCOUNTER — Other Ambulatory Visit (HOSPITAL_COMMUNITY): Payer: Self-pay | Admitting: Obstetrics and Gynecology

## 2015-11-29 DIAGNOSIS — Z3689 Encounter for other specified antenatal screening: Secondary | ICD-10-CM

## 2015-11-29 DIAGNOSIS — O280 Abnormal hematological finding on antenatal screening of mother: Secondary | ICD-10-CM

## 2015-11-29 DIAGNOSIS — Z3A21 21 weeks gestation of pregnancy: Secondary | ICD-10-CM

## 2015-12-07 ENCOUNTER — Ambulatory Visit (HOSPITAL_COMMUNITY)
Admission: RE | Admit: 2015-12-07 | Discharge: 2015-12-07 | Disposition: A | Discharge status: Home / Self Care | Payer: Medicaid Other | Source: Ambulatory Visit | Attending: Obstetrics and Gynecology | Admitting: Obstetrics and Gynecology

## 2015-12-07 ENCOUNTER — Encounter (HOSPITAL_COMMUNITY): Payer: Self-pay

## 2015-12-07 DIAGNOSIS — Z3A21 21 weeks gestation of pregnancy: Secondary | ICD-10-CM | POA: Diagnosis not present

## 2015-12-07 DIAGNOSIS — O280 Abnormal hematological finding on antenatal screening of mother: Secondary | ICD-10-CM | POA: Insufficient documentation

## 2015-12-07 DIAGNOSIS — O283 Abnormal ultrasonic finding on antenatal screening of mother: Secondary | ICD-10-CM | POA: Insufficient documentation

## 2015-12-07 DIAGNOSIS — Z3689 Encounter for other specified antenatal screening: Secondary | ICD-10-CM

## 2015-12-07 DIAGNOSIS — Z315 Encounter for genetic counseling: Secondary | ICD-10-CM | POA: Insufficient documentation

## 2015-12-07 NOTE — Progress Notes (Signed)
Genetic Counseling  High-Risk Gestation Note  Appointment Date:  12/07/2015 Referred By: Waynard Reeds, MD Date of Birth:  Apr 18, 1991   Pregnancy History: G2P1001 Estimated Date of Delivery: 04/14/16  Estimated Gestational Age: [redacted]w[redacted]d  Attending: Ledon Snare, MD   Lori Mcmahon was seen for genetic counseling because of an increased risk for fetal Down syndrome based on Quad screen. She was accompanied by her mother (her biological maternal aunt), Lori Mcmahon.   In Summary:   1 in 9 Down syndrome risk from Quad screen  Detailed ultrasound performed today  Patient elected to pursue NIPS (Panorama) today  Declined amniocentesis  Family history significant for maternal half-sister with apparently isolated congenital heart defect  She was counseled regarding the Quad screen result and the associated 1 in 93 risk for fetal Down syndrome.  We reviewed chromosomes, nondisjunction, and the common features and variable prognosis of Down syndrome.  In addition, we reviewed the screen adjusted reduction in risks for trisomy 18  and ONTDs.  We also discussed other explanations for a screen positive result including: a gestational dating error, differences in maternal metabolism, and normal variation. They understand that this screening is not diagnostic for Down syndrome but provides a risk assessment.  We reviewed available screening options including noninvasive prenatal screening (NIPS)/cell free DNA (cfDNA) testing and detailed ultrasound.  They were counseled that screening tests are used to modify a patient's a priori risk for aneuploidy, typically based on age. This estimate provides a pregnancy specific risk assessment. We reviewed the benefits and limitations of each option. Specifically, we discussed the conditions for which each test screens, the detection rates, and false positive rates of each. They were also counseled regarding diagnostic testing via amniocentesis. We reviewed the  approximate 1 in 300-500 risk for complications for amniocentesis, including spontaneous pregnancy loss.   After consideration of all the options, she elected to proceed with NIPS (Panorama through [facility]).  Those results will be available in 8-10 days.  The patient also expressed interest in having a detailed ultrasound.  A complete ultrasound was performed today. The ultrasound report will be sent under separate cover. There were no visualized fetal anomalies or markers suggestive of aneuploidy. Diagnostic testing was declined today.  She understands that screening tests cannot rule out all birth defects or genetic syndromes. The patient was advised of this limitation and states she still does not want additional testing at this time.   Lori Mcmahon was provided with written information regarding cystic fibrosis (CF) including the carrier frequency and incidence in the Caucasian population, the availability of carrier testing and prenatal diagnosis if indicated.  In addition, we discussed that CF is routinely screened for as part of the [place] newborn screening panel.  She declined CF carrier testing today.   Both family histories were reviewed and found to be contributory for congenital hole in the heart for the patient's maternal half-sister. She reportedly had a hole between her two atria and has had two surgical corrections. She is otherwise healthy. The patient reported that her biological mother had exposure to drugs and alcohol in all of her pregnancies, including the pregnancy with the patient's maternal half-sister. Congenital heart defects occur in approximately 1% of pregnancies.  Congenital heart defects may occur due to multifactorial influences, chromosomal abnormalities, genetic syndromes or environmental exposures.  Isolated heart defects are generally multifactorial. We discussed that prenatal alcohol use can increase the risk for congenital heart defects.  Given the reported  family history and assuming  multifactorial inheritance, the risk for a congenital heart defect in the current pregnancy (a third degree relative to the affected individual) does not appear to be increased above the general population risk. Without further information regarding the provided family history, an accurate genetic risk cannot be calculated. Further genetic counseling is warranted if more information is obtained.  Lori Mcmahon denied exposure to environmental toxins or chemical agents. She denied the use of alcohol or tobacco. She reported smoking marijuana approximately 5 times per day.  We reviewed that available data regarding recreational use of marijuana in pregnancy have not indicated an association with increased risk for birth defects. Some studies have indicated a possible association with prenatal marijuana use and decreased fetal growth. Given this association, we discussed that no marijuana use in pregnancy is recommended. We discussed that detailed ultrasound is available to assess fetal anatomy and growth in detail.  She denied significant viral illnesses during the course of her pregnancy. Her medical and surgical histories were noncontributory.   I counseled Lori Mcmahon for approximately 45 minutes regarding the above risks and available options.   Lori PlowmanKaren Vinia Jemmott, MS,  Certified Genetic Counselor 12/07/2015

## 2015-12-14 ENCOUNTER — Other Ambulatory Visit (HOSPITAL_COMMUNITY): Payer: Self-pay

## 2015-12-14 ENCOUNTER — Telehealth (HOSPITAL_COMMUNITY): Payer: Self-pay | Admitting: MS"

## 2015-12-14 NOTE — Telephone Encounter (Signed)
Called Lori Mcmahon to discuss her prenatal cell free DNA test results.  Ms. Lori HeinzHaylie Auld had Panorama testing through [facility].  Testing was offered because of abnormal maternal serum screening.   The patient was identified by name and DOB.  We reviewed that these are within normal limits, showing a less than 1 in 10,000 risk for trisomies 21, 18 and 13, and monosomy X (Turner syndrome).  In addition, the risk for triploidy/vanishing twin and sex chromosome trisomies (47,XXX and 47,XXY) was also low risk.  This testing identifies > 99% of pregnancies with trisomy 7021, trisomy 6013, sex chromosome trisomies (47,XXX and 47,XXY), and triploidy. The detection rate for trisomy 18 is 96%.  The detection rate for monosomy X is ~92%.  The false positive rate is <0.1% for all conditions. Testing was also consistent with female fetal sex.  She understands that this testing does not identify all genetic conditions.  All questions were answered to her satisfaction, she was encouraged to call with additional questions or concerns.  Quinn PlowmanKaren Jerrica Thorman, MS Certified Genetic Counselor 12/14/2015 2:29 PM

## 2015-12-15 ENCOUNTER — Other Ambulatory Visit (HOSPITAL_COMMUNITY): Payer: Self-pay

## 2016-01-04 ENCOUNTER — Other Ambulatory Visit (HOSPITAL_COMMUNITY): Payer: Self-pay | Admitting: Obstetrics and Gynecology

## 2016-01-04 ENCOUNTER — Ambulatory Visit (HOSPITAL_COMMUNITY)
Admission: RE | Admit: 2016-01-04 | Discharge: 2016-01-04 | Disposition: A | Discharge status: Home / Self Care | Payer: Medicaid Other | Source: Ambulatory Visit | Attending: Obstetrics and Gynecology | Admitting: Obstetrics and Gynecology

## 2016-01-04 ENCOUNTER — Encounter (HOSPITAL_COMMUNITY): Payer: Self-pay

## 2016-01-04 DIAGNOSIS — IMO0002 Reserved for concepts with insufficient information to code with codable children: Secondary | ICD-10-CM

## 2016-01-04 DIAGNOSIS — O99213 Obesity complicating pregnancy, third trimester: Secondary | ICD-10-CM

## 2016-01-04 DIAGNOSIS — Z8279 Family history of other congenital malformations, deformations and chromosomal abnormalities: Secondary | ICD-10-CM | POA: Diagnosis not present

## 2016-01-04 DIAGNOSIS — Z3A25 25 weeks gestation of pregnancy: Secondary | ICD-10-CM

## 2016-01-04 DIAGNOSIS — O283 Abnormal ultrasonic finding on antenatal screening of mother: Secondary | ICD-10-CM | POA: Insufficient documentation

## 2016-01-04 DIAGNOSIS — O99212 Obesity complicating pregnancy, second trimester: Secondary | ICD-10-CM

## 2016-01-04 DIAGNOSIS — IMO0001 Reserved for inherently not codable concepts without codable children: Secondary | ICD-10-CM

## 2016-01-04 DIAGNOSIS — O280 Abnormal hematological finding on antenatal screening of mother: Secondary | ICD-10-CM

## 2016-01-04 DIAGNOSIS — Z0489 Encounter for examination and observation for other specified reasons: Secondary | ICD-10-CM

## 2016-01-04 DIAGNOSIS — Z8249 Family history of ischemic heart disease and other diseases of the circulatory system: Secondary | ICD-10-CM

## 2016-01-04 DIAGNOSIS — O09292 Supervision of pregnancy with other poor reproductive or obstetric history, second trimester: Secondary | ICD-10-CM | POA: Insufficient documentation

## 2016-02-21 ENCOUNTER — Encounter (HOSPITAL_COMMUNITY): Payer: Self-pay | Admitting: *Deleted

## 2016-02-21 ENCOUNTER — Inpatient Hospital Stay (HOSPITAL_COMMUNITY)
Admission: AD | Admit: 2016-02-21 | Discharge: 2016-02-21 | Disposition: A | Discharge status: Home / Self Care | Payer: Medicaid Other | Source: Ambulatory Visit | Attending: Obstetrics and Gynecology | Admitting: Obstetrics and Gynecology

## 2016-02-21 DIAGNOSIS — O99613 Diseases of the digestive system complicating pregnancy, third trimester: Secondary | ICD-10-CM | POA: Diagnosis not present

## 2016-02-21 DIAGNOSIS — Z3A32 32 weeks gestation of pregnancy: Secondary | ICD-10-CM | POA: Insufficient documentation

## 2016-02-21 DIAGNOSIS — O9989 Other specified diseases and conditions complicating pregnancy, childbirth and the puerperium: Secondary | ICD-10-CM | POA: Diagnosis not present

## 2016-02-21 DIAGNOSIS — K529 Noninfective gastroenteritis and colitis, unspecified: Secondary | ICD-10-CM | POA: Diagnosis not present

## 2016-02-21 DIAGNOSIS — O212 Late vomiting of pregnancy: Secondary | ICD-10-CM | POA: Diagnosis present

## 2016-02-21 LAB — URINALYSIS, ROUTINE W REFLEX MICROSCOPIC
Bilirubin Urine: NEGATIVE
GLUCOSE, UA: NEGATIVE mg/dL
HGB URINE DIPSTICK: NEGATIVE
Nitrite: NEGATIVE
PH: 7 (ref 5.0–8.0)
PROTEIN: NEGATIVE mg/dL
Specific Gravity, Urine: 1.01 (ref 1.005–1.030)

## 2016-02-21 LAB — CBC
HEMATOCRIT: 36.3 % (ref 36.0–46.0)
Hemoglobin: 12.2 g/dL (ref 12.0–15.0)
MCH: 30.2 pg (ref 26.0–34.0)
MCHC: 33.6 g/dL (ref 30.0–36.0)
MCV: 89.9 fL (ref 78.0–100.0)
Platelets: 171 10*3/uL (ref 150–400)
RBC: 4.04 MIL/uL (ref 3.87–5.11)
RDW: 12.5 % (ref 11.5–15.5)
WBC: 11.2 10*3/uL — AB (ref 4.0–10.5)

## 2016-02-21 LAB — COMPREHENSIVE METABOLIC PANEL
ALT: 11 U/L — AB (ref 14–54)
AST: 10 U/L — AB (ref 15–41)
Albumin: 3.1 g/dL — ABNORMAL LOW (ref 3.5–5.0)
Alkaline Phosphatase: 106 U/L (ref 38–126)
Anion gap: 12 (ref 5–15)
BILIRUBIN TOTAL: 0.5 mg/dL (ref 0.3–1.2)
BUN: 5 mg/dL — AB (ref 6–20)
CO2: 20 mmol/L — ABNORMAL LOW (ref 22–32)
CREATININE: 0.46 mg/dL (ref 0.44–1.00)
Calcium: 8.5 mg/dL — ABNORMAL LOW (ref 8.9–10.3)
Chloride: 107 mmol/L (ref 101–111)
GFR calc Af Amer: >60 mL/min (ref >60)
Glucose, Bld: 73 mg/dL (ref 65–99)
Potassium: 3.5 mmol/L (ref 3.5–5.1)
Sodium: 139 mmol/L (ref 135–145)
TOTAL PROTEIN: 6.5 g/dL (ref 6.5–8.1)

## 2016-02-21 LAB — URINE MICROSCOPIC-ADD ON: RBC / HPF: NONE SEEN RBC/hpf (ref 0–5)

## 2016-02-21 LAB — LIPASE, BLOOD: LIPASE: 15 U/L (ref 11–51)

## 2016-02-21 LAB — AMYLASE: Amylase: 64 U/L (ref 28–100)

## 2016-02-21 MED ORDER — ONDANSETRON HCL 40 MG/20ML IJ SOLN
8.0000 mg | Freq: Once | INTRAMUSCULAR | Status: AC
  Administered 2016-02-21: 8 mg via INTRAVENOUS
  Filled 2016-02-21: qty 4

## 2016-02-21 MED ORDER — ONDANSETRON 8 MG PO TBDP: 8.0000 mg | ORAL_TABLET | Freq: Three times a day (TID) | ORAL | Status: DC | PRN

## 2016-02-21 MED ORDER — DEXTROSE 5 % IN LACTATED RINGERS IV BOLUS
1000.0000 mL | Freq: Once | INTRAVENOUS | Status: AC
  Administered 2016-02-21: 1000 mL via INTRAVENOUS

## 2016-02-21 MED ORDER — FAMOTIDINE IN NACL 20-0.9 MG/50ML-% IV SOLN
20.0000 mg | Freq: Once | INTRAVENOUS | Status: AC
  Administered 2016-02-21: 20 mg via INTRAVENOUS
  Filled 2016-02-21: qty 50

## 2016-02-21 MED ORDER — PROMETHAZINE HCL 25 MG PO TABS: 25.0000 mg | ORAL_TABLET | Freq: Four times a day (QID) | ORAL | Status: DC | PRN

## 2016-02-21 NOTE — Discharge Instructions (Signed)

## 2016-02-21 NOTE — MAU Provider Note (Signed)
History     CSN: [id number]  Arrival date and time: 02/21/16 1116   First Provider Initiated Contact with Patient 02/21/16 1326       Chief Complaint  Patient presents with  . Emesis  . Diarrhea   HPI Lori Mcmahon is a 25 y.o. G2P1001 at [redacted]w[redacted]d who presents with 2 day history of n/v/d. Reports countless episodes of vomiting & watery diarrhea since yesterday morning. Does not have antiemetic at home.  Also reports abdominal tightening that is constant & epigastric pain/heartburn. Denies vaginal bleedin or LOF. Denies fever or sick contacts.  Positive fetal movement.   OB History    Gravida Para Term Preterm AB TAB SAB Ectopic Multiple Living   Past Medical History  Diagnosis Date  . Pregnancy induced hypertension   . Low blood potassium     Past Surgical History  Procedure Laterality Date  . Tonsillectomy    . Knee surgery      Family History  Problem Relation Age of Onset  . Other Mother     Preeclampsia in labor    Social History  Substance Use Topics  . Smoking status: Never Smoker   . Smokeless tobacco: Never Used  . Alcohol Use: No     Comment: stopped 1 1/2 months ago, would smoke 1-2 times a day    Allergies:  Allergies  Allergen Reactions  . Ultram [Tramadol] Hives    Unknown reaction was rushed to ER when she took tramadol    Prescriptions prior to admission  Medication Sig Dispense Refill Last Dose  . acetaminophen (TYLENOL) 325 MG tablet Take 650 mg by mouth daily as needed for pain. Reported on 01/04/2016   Past Month at Unknown time  . calcium carbonate (TUMS - DOSED IN MG ELEMENTAL CALCIUM) 500 MG chewable tablet Chew 1 tablet by mouth daily as needed for heartburn.   Past Week at Unknown time  . Prenatal Vit-Fe Fumarate-FA (PRENATAL MULTIVITAMIN) TABS Take 1 tablet by mouth at bedtime.   Past Week at Unknown time    Review of Systems  Constitutional: Negative.   Gastrointestinal: Positive for heartburn, nausea,  vomiting, abdominal pain and diarrhea. Negative for constipation and blood in stool.  Genitourinary: Negative.   Neurological: Negative for dizziness and headaches.   Physical Exam   Blood pressure 115/68, pulse 88, temperature 98.3 F (36.8 C), temperature source Oral, resp. rate 18, height  (1.803 m), weight 235 lb (106.595 kg), last menstrual period 07/09/2015, unknown if currently breastfeeding.  Physical Exam  Nursing note and vitals reviewed. Constitutional: She is oriented to person, place, and time. She appears well-developed and well-nourished. No distress.  HENT:  Head: Normocephalic and atraumatic.  Eyes: Conjunctivae are normal. Right eye exhibits no discharge. Left eye exhibits no discharge. No scleral icterus.  Neck: Normal range of motion.  Cardiovascular: Normal rate, regular rhythm and normal heart sounds.   No murmur heard. Respiratory: Effort normal and breath sounds normal. No respiratory distress. She has no wheezes.  GI: Soft. Bowel sounds are normal. There is tenderness (mild tenderness in epigastric region). There is no guarding and negative Murphy's sign.  Neurological: She is alert and oriented to person, place, and time.  Skin: Skin is warm and dry. She is not diaphoretic.  Psychiatric: She has a normal mood and affect. Her behavior is normal. Judgment and thought content normal.   Dilation: Closed Exam by:: E.  Lyman BishopLawrence, NP  Fetal Tracing:  Baseline: 140 Variability: moderate Accelerations: 15x15 Decelerations: none  Toco:  x2   MAU Course  Procedures Results for orders placed or performed during the hospital encounter of 02/21/16 (from the past 24 hour(s))  Urinalysis, Routine w reflex microscopic ([facility])     Status: Abnormal   Collection Time: 02/21/16 11:23 AM  Result Value Ref Range   Color, Urine YELLOW YELLOW   APPearance CLEAR CLEAR   Specific Gravity, Urine 1.010 1.005 - 1.030   pH 7.0 5.0 - 8.0   Glucose, UA NEGATIVE  NEGATIVE mg/dL   Hgb urine dipstick NEGATIVE NEGATIVE   Bilirubin Urine NEGATIVE NEGATIVE   Ketones, ur >80 (A) NEGATIVE mg/dL   Protein, ur NEGATIVE NEGATIVE mg/dL   Nitrite NEGATIVE NEGATIVE   Leukocytes, UA MODERATE (A) NEGATIVE  Urine microscopic-add on     Status: Abnormal   Collection Time: 02/21/16 11:23 AM  Result Value Ref Range   Squamous Epithelial / LPF 6-30 (A) NONE SEEN   WBC, UA 6-30 0 - 5 WBC/hpf   RBC / HPF NONE SEEN 0 - 5 RBC/hpf   Bacteria, UA FEW (A) NONE SEEN   Urine-Other MUCOUS PRESENT   CBC     Status: Abnormal   Collection Time: 02/21/16 12:22 PM  Result Value Ref Range   WBC 11.2 (H) 4.0 - 10.5 K/uL   RBC 4.04 3.87 - 5.11 MIL/uL   Hemoglobin 12.2 12.0 - 15.0 g/dL   HCT 16.136.3 09.636.0 - 04.546.0 %   MCV 89.9 78.0 - 100.0 fL   MCH 30.2 26.0 - 34.0 pg   MCHC 33.6 30.0 - 36.0 g/dL   RDW 40.912.5 81.111.5 - 91.415.5 %   Platelets 171 150 - 400 K/uL  Comprehensive metabolic panel     Status: Abnormal   Collection Time: 02/21/16 12:22 PM  Result Value Ref Range   Sodium 139 135 - 145 mmol/L   Potassium 3.5 3.5 - 5.1 mmol/L   Chloride 107 101 - 111 mmol/L   CO2 20 (L) 22 - 32 mmol/L   Glucose, Bld 73 65 - 99 mg/dL   BUN 5 (L) 6 - 20 mg/dL   Creatinine, Ser 7.820.46 0.44 - 1.00 mg/dL   Calcium 8.5 (L) 8.9 - 10.3 mg/dL   Total Protein 6.5 6.5 - 8.1 g/dL   Albumin 3.1 (L) 3.5 - 5.0 g/dL   AST 10 (L) 15 - 41 U/L   ALT 11 (L) 14 - 54 U/L   Alkaline Phosphatase 106 38 - 126 U/L   Total Bilirubin 0.5 0.3 - 1.2 mg/dL   GFR calc non Af Amer >60 >60 mL/min   GFR calc Af Amer >60 >60 mL/min   Anion gap 12 5 - 15  Lipase, blood     Status: None   Collection Time: 02/21/16 12:22 PM  Result Value Ref Range   Lipase 15 11 - 51 U/L  Amylase     Status: None   Collection Time: 02/21/16 12:22 PM  Result Value Ref Range   Amylase 64 28 - 100 U/L    MDM Category 1 tracing Cervix closed IV D5 LR Zofran 8 mg IV Pepcid 20 mg IV Patient reports improvement in symptoms; not observed  vomiting. Able to tolerate po fluids S/w Dr. Tenny Crawoss. Ok to discharge home with rx for antiemetics. Patient to reschedule her office visit.   Assessment and Plan  A: 1. Acute gastroenteritis     P: Discharge home Rx  zofran & phenergan Call office to reschedule today's appointment Discussed diet appropriate for n/v/d Discussed reasons to return to MAU Encouraged hand hygiene  Judeth Horn 02/21/2016, 11:58 AM

## 2016-02-21 NOTE — MAU Note (Signed)
Pt states she and her son were sick three week ago with nausea and vomiting.  Pt thought she had overcome illness. States three days ago started vomiting, having diarrhea, and can't keep fluids down.

## 2016-02-29 ENCOUNTER — Encounter (HOSPITAL_COMMUNITY): Payer: Self-pay | Admitting: Obstetrics and Gynecology

## 2016-02-29 ENCOUNTER — Other Ambulatory Visit (HOSPITAL_COMMUNITY): Payer: Self-pay | Admitting: Obstetrics and Gynecology

## 2016-02-29 DIAGNOSIS — O368390 Maternal care for abnormalities of the fetal heart rate or rhythm, unspecified trimester, not applicable or unspecified: Secondary | ICD-10-CM

## 2016-02-29 DIAGNOSIS — Z3A34 34 weeks gestation of pregnancy: Secondary | ICD-10-CM

## 2016-03-02 ENCOUNTER — Encounter (HOSPITAL_COMMUNITY)
Admission: AD | Disposition: A | Discharge status: Home / Self Care | Payer: Self-pay | Source: Ambulatory Visit | Attending: Obstetrics and Gynecology

## 2016-03-02 ENCOUNTER — Encounter (HOSPITAL_COMMUNITY): Payer: Self-pay | Admitting: Certified Nurse Midwife

## 2016-03-02 ENCOUNTER — Inpatient Hospital Stay (HOSPITAL_COMMUNITY)
Admission: AD | Admit: 2016-03-02 | Discharge: 2016-03-06 | DRG: 765 | Disposition: A | Discharge status: Home / Self Care | Payer: Medicaid Other | Source: Ambulatory Visit | Attending: Obstetrics and Gynecology | Admitting: Obstetrics and Gynecology

## 2016-03-02 ENCOUNTER — Inpatient Hospital Stay (HOSPITAL_COMMUNITY): Payer: Medicaid Other

## 2016-03-02 ENCOUNTER — Inpatient Hospital Stay (HOSPITAL_COMMUNITY): Payer: Medicaid Other | Admitting: Anesthesiology

## 2016-03-02 DIAGNOSIS — Z9889 Other specified postprocedural states: Secondary | ICD-10-CM | POA: Diagnosis not present

## 2016-03-02 DIAGNOSIS — O99324 Drug use complicating childbirth: Secondary | ICD-10-CM | POA: Diagnosis present

## 2016-03-02 DIAGNOSIS — IMO0002 Reserved for concepts with insufficient information to code with codable children: Secondary | ICD-10-CM

## 2016-03-02 DIAGNOSIS — Z3A33 33 weeks gestation of pregnancy: Secondary | ICD-10-CM

## 2016-03-02 DIAGNOSIS — F1290 Cannabis use, unspecified, uncomplicated: Secondary | ICD-10-CM | POA: Diagnosis present

## 2016-03-02 DIAGNOSIS — O9989 Other specified diseases and conditions complicating pregnancy, childbirth and the puerperium: Secondary | ICD-10-CM | POA: Diagnosis not present

## 2016-03-02 DIAGNOSIS — R109 Unspecified abdominal pain: Secondary | ICD-10-CM | POA: Diagnosis not present

## 2016-03-02 DIAGNOSIS — Z98891 History of uterine scar from previous surgery: Secondary | ICD-10-CM | POA: Diagnosis present

## 2016-03-02 DIAGNOSIS — O139 Gestational [pregnancy-induced] hypertension without significant proteinuria, unspecified trimester: Secondary | ICD-10-CM | POA: Diagnosis present

## 2016-03-02 LAB — CBC
HEMATOCRIT: 35.1 % — AB (ref 36.0–46.0)
HEMATOCRIT: 32.1 % — AB (ref 36.0–46.0)
HEMOGLOBIN: 12.1 g/dL (ref 12.0–15.0)
HEMOGLOBIN: 10.7 g/dL — AB (ref 12.0–15.0)
MCH: 30.9 pg (ref 26.0–34.0)
MCH: 30.1 pg (ref 26.0–34.0)
MCHC: 34.5 g/dL (ref 30.0–36.0)
MCHC: 33.3 g/dL (ref 30.0–36.0)
MCV: 89.5 fL (ref 78.0–100.0)
MCV: 90.4 fL (ref 78.0–100.0)
Platelets: 157 10*3/uL (ref 150–400)
Platelets: 147 10*3/uL — ABNORMAL LOW (ref 150–400)
RBC: 3.92 MIL/uL (ref 3.87–5.11)
RBC: 3.55 MIL/uL — AB (ref 3.87–5.11)
RDW: 12.9 % (ref 11.5–15.5)
RDW: 12.8 % (ref 11.5–15.5)
WBC: 16.6 10*3/uL — AB (ref 4.0–10.5)
WBC: 21.5 10*3/uL — ABNORMAL HIGH (ref 4.0–10.5)

## 2016-03-02 LAB — COMPREHENSIVE METABOLIC PANEL
ALBUMIN: 3.2 g/dL — AB (ref 3.5–5.0)
ALK PHOS: 102 U/L (ref 38–126)
ALT: 13 U/L — ABNORMAL LOW (ref 14–54)
ANION GAP: 12 (ref 5–15)
AST: 11 U/L — AB (ref 15–41)
BILIRUBIN TOTAL: 0.2 mg/dL — AB (ref 0.3–1.2)
BUN: 7 mg/dL (ref 6–20)
CALCIUM: 8.2 mg/dL — AB (ref 8.9–10.3)
CO2: 19 mmol/L — AB (ref 22–32)
Chloride: 108 mmol/L (ref 101–111)
Creatinine, Ser: 0.57 mg/dL (ref 0.44–1.00)
GFR calc Af Amer: >60 mL/min (ref >60)
GFR calc non Af Amer: >60 mL/min (ref >60)
GLUCOSE: 83 mg/dL (ref 65–99)
Potassium: 3.1 mmol/L — ABNORMAL LOW (ref 3.5–5.1)
SODIUM: 139 mmol/L (ref 135–145)
TOTAL PROTEIN: 6.7 g/dL (ref 6.5–8.1)

## 2016-03-02 LAB — TYPE AND SCREEN
ABO/RH(D): O POS
Antibody Screen: NEGATIVE

## 2016-03-02 SURGERY — Surgical Case
Anesthesia: General

## 2016-03-02 MED ORDER — HYDROMORPHONE HCL 1 MG/ML IJ SOLN
0.2500 mg | INTRAMUSCULAR | Status: DC | PRN
  Administered 2016-03-02 (×2): 0.5 mg via INTRAVENOUS

## 2016-03-02 MED ORDER — SCOPOLAMINE 1 MG/3DAYS TD PT72
MEDICATED_PATCH | TRANSDERMAL | Status: AC
  Filled 2016-03-02: qty 1

## 2016-03-02 MED ORDER — LIDOCAINE HCL (CARDIAC) 20 MG/ML IV SOLN: INTRAVENOUS | Status: DC | PRN

## 2016-03-02 MED ORDER — HYDROMORPHONE HCL 1 MG/ML IJ SOLN
INTRAMUSCULAR | Status: AC
  Administered 2016-03-02: 0.5 mg via INTRAVENOUS
  Filled 2016-03-02: qty 1

## 2016-03-02 MED ORDER — OXYTOCIN 10 UNIT/ML IJ SOLN: 2.5000 [IU]/h | INTRAVENOUS | Status: AC

## 2016-03-02 MED ORDER — TETANUS-DIPHTH-ACELL PERTUSSIS 5-2.5-18.5 LF-MCG/0.5 IM SUSP: 0.5000 mL | Freq: Once | INTRAMUSCULAR | Status: DC

## 2016-03-02 MED ORDER — CEFAZOLIN SODIUM 1-5 GM-% IV SOLN
1.0000 g | Freq: Three times a day (TID) | INTRAVENOUS | Status: DC
Start: 2016-03-03 — End: 2016-03-04
  Administered 2016-03-03 (×2): 1 g via INTRAVENOUS
  Filled 2016-03-02 (×6): qty 50

## 2016-03-02 MED ORDER — MENTHOL 3 MG MT LOZG: 1.0000 | LOZENGE | OROMUCOSAL | Status: DC | PRN

## 2016-03-02 MED ORDER — SUCCINYLCHOLINE CHLORIDE 20 MG/ML IJ SOLN
INTRAMUSCULAR | Status: DC | PRN
  Administered 2016-03-02: 200 mg via INTRAVENOUS

## 2016-03-02 MED ORDER — IBUPROFEN 600 MG PO TABS
600.0000 mg | ORAL_TABLET | Freq: Four times a day (QID) | ORAL | Status: DC
  Administered 2016-03-03 – 2016-03-06 (×14): 600 mg via ORAL
  Filled 2016-03-02 (×14): qty 1

## 2016-03-02 MED ORDER — FENTANYL CITRATE (PF) 100 MCG/2ML IJ SOLN
50.0000 ug | Freq: Once | INTRAMUSCULAR | Status: AC
  Administered 2016-03-02: 50 ug via INTRAMUSCULAR

## 2016-03-02 MED ORDER — ACETAMINOPHEN 325 MG PO TABS: 650.0000 mg | ORAL_TABLET | ORAL | Status: DC | PRN

## 2016-03-02 MED ORDER — SUCCINYLCHOLINE CHLORIDE 20 MG/ML IJ SOLN
INTRAMUSCULAR | Status: AC
  Filled 2016-03-02: qty 1

## 2016-03-02 MED ORDER — FENTANYL CITRATE (PF) 100 MCG/2ML IJ SOLN
INTRAMUSCULAR | Status: AC
  Filled 2016-03-02: qty 2

## 2016-03-02 MED ORDER — NALOXONE HCL 0.4 MG/ML IJ SOLN: 0.4000 mg | INTRAMUSCULAR | Status: DC | PRN

## 2016-03-02 MED ORDER — SIMETHICONE 80 MG PO CHEW
80.0000 mg | CHEWABLE_TABLET | ORAL | Status: DC
  Administered 2016-03-03 – 2016-03-05 (×4): 80 mg via ORAL
  Filled 2016-03-02 (×4): qty 1

## 2016-03-02 MED ORDER — DIPHENHYDRAMINE HCL 50 MG/ML IJ SOLN: 12.5000 mg | Freq: Four times a day (QID) | INTRAMUSCULAR | Status: DC | PRN

## 2016-03-02 MED ORDER — DIPHENHYDRAMINE HCL 12.5 MG/5ML PO ELIX: 12.5000 mg | ORAL_SOLUTION | Freq: Four times a day (QID) | ORAL | Status: DC | PRN

## 2016-03-02 MED ORDER — DIPHENHYDRAMINE HCL 25 MG PO CAPS: 25.0000 mg | ORAL_CAPSULE | Freq: Four times a day (QID) | ORAL | Status: DC | PRN

## 2016-03-02 MED ORDER — PROPOFOL 10 MG/ML IV BOLUS
INTRAVENOUS | Status: AC
  Filled 2016-03-02: qty 40

## 2016-03-02 MED ORDER — FENTANYL CITRATE (PF) 100 MCG/2ML IJ SOLN
50.0000 ug | Freq: Once | INTRAMUSCULAR | Status: DC
  Filled 2016-03-02: qty 2

## 2016-03-02 MED ORDER — WITCH HAZEL-GLYCERIN EX PADS: 1.0000 "application " | MEDICATED_PAD | CUTANEOUS | Status: DC | PRN

## 2016-03-02 MED ORDER — FENTANYL CITRATE (PF) 250 MCG/5ML IJ SOLN
INTRAMUSCULAR | Status: AC
  Filled 2016-03-02: qty 5

## 2016-03-02 MED ORDER — CEFAZOLIN SODIUM-DEXTROSE 2-3 GM-% IV SOLR
INTRAVENOUS | Status: AC
  Filled 2016-03-02: qty 50

## 2016-03-02 MED ORDER — DIBUCAINE 1 % RE OINT: 1.0000 "application " | TOPICAL_OINTMENT | RECTAL | Status: DC | PRN

## 2016-03-02 MED ORDER — LACTATED RINGERS IV SOLN
INTRAVENOUS | Status: DC
  Administered 2016-03-03: 01:00:00 via INTRAVENOUS

## 2016-03-02 MED ORDER — SIMETHICONE 80 MG PO CHEW: 80.0000 mg | CHEWABLE_TABLET | ORAL | Status: DC | PRN

## 2016-03-02 MED ORDER — LANOLIN HYDROUS EX OINT
1.0000 | TOPICAL_OINTMENT | CUTANEOUS | Status: DC | PRN
Start: 2016-03-02 — End: 2016-03-06

## 2016-03-02 MED ORDER — LACTATED RINGERS IV SOLN
INTRAVENOUS | Status: DC | PRN
  Administered 2016-03-02: 16:00:00 via INTRAVENOUS

## 2016-03-02 MED ORDER — HYDROMORPHONE 1 MG/ML IV SOLN
INTRAVENOUS | Status: DC
  Administered 2016-03-02: 2.1 mg via INTRAVENOUS
  Administered 2016-03-02: 20:00:00 via INTRAVENOUS
  Administered 2016-03-03: 2 mg via INTRAVENOUS
  Administered 2016-03-03: 1.5 mg via INTRAVENOUS
  Filled 2016-03-02 (×2): qty 25

## 2016-03-02 MED ORDER — PROPOFOL 10 MG/ML IV BOLUS
INTRAVENOUS | Status: DC | PRN
  Administered 2016-03-02: 200 mg via INTRAVENOUS
  Administered 2016-03-02: 100 mg via INTRAVENOUS

## 2016-03-02 MED ORDER — LACTATED RINGERS IV BOLUS (SEPSIS)
1000.0000 mL | Freq: Once | INTRAVENOUS | Status: AC
  Administered 2016-03-02: 1000 mL via INTRAVENOUS

## 2016-03-02 MED ORDER — SIMETHICONE 80 MG PO CHEW
80.0000 mg | CHEWABLE_TABLET | Freq: Three times a day (TID) | ORAL | Status: DC
  Administered 2016-03-03 – 2016-03-06 (×7): 80 mg via ORAL
  Filled 2016-03-02 (×8): qty 1

## 2016-03-02 MED ORDER — FENTANYL CITRATE (PF) 100 MCG/2ML IJ SOLN
INTRAMUSCULAR | Status: DC | PRN
  Administered 2016-03-02 (×2): 50 ug via INTRAVENOUS
  Administered 2016-03-02: 250 ug via INTRAVENOUS
  Administered 2016-03-02: 100 ug via INTRAVENOUS
  Administered 2016-03-02 (×2): 50 ug via INTRAVENOUS

## 2016-03-02 MED ORDER — PRENATAL MULTIVITAMIN CH
1.0000 | ORAL_TABLET | Freq: Every day | ORAL | Status: DC
  Administered 2016-03-03 – 2016-03-06 (×3): 1 via ORAL
  Filled 2016-03-02 (×3): qty 1

## 2016-03-02 MED ORDER — MIDAZOLAM HCL 2 MG/2ML IJ SOLN
INTRAMUSCULAR | Status: DC | PRN
  Administered 2016-03-02: 2 mg via INTRAVENOUS

## 2016-03-02 MED ORDER — MIDAZOLAM HCL 2 MG/2ML IJ SOLN
INTRAMUSCULAR | Status: AC
  Filled 2016-03-02: qty 2

## 2016-03-02 MED ORDER — OXYTOCIN 10 UNIT/ML IJ SOLN
40.0000 [IU] | INTRAMUSCULAR | Status: DC | PRN
  Administered 2016-03-02: 40 [IU] via INTRAVENOUS

## 2016-03-02 MED ORDER — ONDANSETRON HCL 4 MG/2ML IJ SOLN: 4.0000 mg | Freq: Four times a day (QID) | INTRAMUSCULAR | Status: DC | PRN

## 2016-03-02 MED ORDER — CEFAZOLIN SODIUM-DEXTROSE 2-3 GM-% IV SOLR
INTRAVENOUS | Status: DC | PRN
  Administered 2016-03-02: 2 g via INTRAVENOUS

## 2016-03-02 MED ORDER — SODIUM CHLORIDE 0.9 % IR SOLN
Status: DC | PRN
  Administered 2016-03-02: 1

## 2016-03-02 MED ORDER — SODIUM CHLORIDE 0.9% FLUSH: 9.0000 mL | INTRAVENOUS | Status: DC | PRN

## 2016-03-02 MED ORDER — ONDANSETRON HCL 4 MG/2ML IJ SOLN
INTRAMUSCULAR | Status: AC
  Filled 2016-03-02: qty 2

## 2016-03-02 MED ORDER — ZOLPIDEM TARTRATE 5 MG PO TABS
5.0000 mg | ORAL_TABLET | Freq: Every evening | ORAL | Status: DC | PRN
  Administered 2016-03-04: 5 mg via ORAL
  Filled 2016-03-02: qty 1

## 2016-03-02 MED ORDER — SENNOSIDES-DOCUSATE SODIUM 8.6-50 MG PO TABS
2.0000 | ORAL_TABLET | ORAL | Status: DC
  Administered 2016-03-03 – 2016-03-05 (×4): 2 via ORAL
  Filled 2016-03-02 (×4): qty 2

## 2016-03-02 MED ORDER — SCOPOLAMINE 1 MG/3DAYS TD PT72
MEDICATED_PATCH | TRANSDERMAL | Status: DC | PRN
  Administered 2016-03-02: 1 via TRANSDERMAL

## 2016-03-02 MED ORDER — ONDANSETRON HCL 4 MG/2ML IJ SOLN
INTRAMUSCULAR | Status: DC | PRN
  Administered 2016-03-02: 4 mg via INTRAVENOUS

## 2016-03-02 SURGICAL SUPPLY — 31 items
CLAMP CORD UMBIL (MISCELLANEOUS) IMPLANT
CLOTH BEACON ORANGE TIMEOUT ST (SAFETY) ×2 IMPLANT
DRSG OPSITE POSTOP 4X10 (GAUZE/BANDAGES/DRESSINGS) ×2 IMPLANT
DURAPREP 26ML APPLICATOR (WOUND CARE) ×2 IMPLANT
ELECT REM PT RETURN 9FT ADLT (ELECTROSURGICAL) ×2
ELECTRODE REM PT RTRN 9FT ADLT (ELECTROSURGICAL) ×1 IMPLANT
EXTRACTOR VACUUM M CUP 4 TUBE (SUCTIONS) IMPLANT
GLOVE BIO SURGEON STRL SZ7 (GLOVE) ×2 IMPLANT
GLOVE BIOGEL PI IND STRL 7.0 (GLOVE) ×1 IMPLANT
GLOVE BIOGEL PI INDICATOR 7.0 (GLOVE) ×1
GOWN STRL REUS W/TWL LRG LVL3 (GOWN DISPOSABLE) ×4 IMPLANT
KIT ABG SYR 3ML LUER SLIP (SYRINGE) IMPLANT
LIQUID BAND (GAUZE/BANDAGES/DRESSINGS) ×4 IMPLANT
NEEDLE HYPO 22GX1.5 SAFETY (NEEDLE) IMPLANT
NEEDLE HYPO 25X5/8 SAFETYGLIDE (NEEDLE) IMPLANT
NS IRRIG 1000ML POUR BTL (IV SOLUTION) ×2 IMPLANT
PACK C SECTION WH (CUSTOM PROCEDURE TRAY) ×2 IMPLANT
PAD OB MATERNITY 4.3X12.25 (PERSONAL CARE ITEMS) ×2 IMPLANT
PENCIL SMOKE EVAC W/HOLSTER (ELECTROSURGICAL) ×2 IMPLANT
RTRCTR C-SECT PINK 25CM LRG (MISCELLANEOUS) ×2 IMPLANT
SUT CHROMIC 1 CTX 36 (SUTURE) ×6 IMPLANT
SUT CHROMIC 2 0 CT 1 (SUTURE) ×4 IMPLANT
SUT PDS AB 0 CTX 36 PDP370T (SUTURE) ×2 IMPLANT
SUT PDS AB 0 CTX 60 (SUTURE) ×4 IMPLANT
SUT PLAIN 2 0 XLH (SUTURE) ×2 IMPLANT
SUT VIC AB 2-0 CT1 27 (SUTURE) ×2
SUT VIC AB 2-0 CT1 TAPERPNT 27 (SUTURE) ×2 IMPLANT
SUT VIC AB 4-0 KS 27 (SUTURE) ×2 IMPLANT
SYR 30ML LL (SYRINGE) IMPLANT
TOWEL OR 17X24 6PK STRL BLUE (TOWEL DISPOSABLE) ×2 IMPLANT
TRAY FOLEY CATH SILVER 14FR (SET/KITS/TRAYS/PACK) ×2 IMPLANT

## 2016-03-02 NOTE — MAU Note (Signed)
Onset of back pain and abdominal pain since this morning around 6:00 am, no vaginal bleeding, no dysuria, no constipation.

## 2016-03-02 NOTE — H&P (Signed)
Lori Mcmahon is a 25 y.o. female presenting for LLQ pain  25 yo G2P1001 @ 33+6 presents c/o severe left lower pain. Pt was inconsolable and uncontrollable and required narcotic medications in order to assess the fetus. Once the patient was comfortable the fetal heart rate could not be found and a a stat bedside ultrasound showed the FHR in the 50s and a code cesarean was called.   The patient was seen on 02/28/16 and a fetal arrythmia was noted. BPP was 8/8. An MFM was consultation was arranged for next week.   History OB History    Gravida Para Term Preterm AB TAB SAB Ectopic Multiple Living   2 2 1 1      0 2     Past Medical History  Diagnosis Date  . Pregnancy induced hypertension   . Low blood potassium    Past Surgical History  Procedure Laterality Date  . Tonsillectomy    . Knee surgery     Family History: family history includes Other in her mother. Social History:  reports that she has never smoked. She has never used smokeless tobacco. She reports that she uses illicit drugs (Marijuana). She reports that she does not drink alcohol.   Prenatal Transfer Tool  Maternal Diabetes: No Genetic Screening: Normal Pt had late prenatal care and a quad showed and elevated DSR, NIPT was normal, low risk female Maternal Ultrasounds/Referrals: Normal Fetal Ultrasounds or other Referrals:  None Maternal Substance Abuse:  Yes:  Type: Marijuana Significant Maternal Medications:  None Significant Maternal Lab Results:  None Other Comments:  None  ROS: as above  Dilation: Closed Effacement (%): Thick Exam by:: Venia CarbonJennifer Rasch NP Blood pressure 141/97, pulse 83, temperature 97.5 F (36.4 C), resp. rate 18, last menstrual period 07/09/2015, unknown if currently breastfeeding. Exam Physical Exam  Prenatal labs: ABO, Rh: --/--/O POS (03/18 1602) Antibody: NEG (03/18 1602) Rubella:  Immune RPR:   NR HBsAg:   Neg HIV:   NR GBS:   too early  Assessment/Plan: 1) Admit 2) Stat  CS   Mae Cianci H. 03/02/2016, 5:31 PM

## 2016-03-02 NOTE — MAU Note (Signed)
Ultrasound at bedside

## 2016-03-02 NOTE — Anesthesia Preprocedure Evaluation (Addendum)
Anesthesia Evaluation  Patient identified by MRN, date of birth, ID band Patient awake    Reviewed: Allergy & Precautions, H&P , Patient's Chart, lab work & pertinent test results, reviewed documented beta blocker date and time   Airway Mallampati: II  TM Distance: >3 FB Neck ROM: full    Dental no notable dental hx.    Pulmonary    Pulmonary exam normal breath sounds clear to auscultation       Cardiovascular hypertension,  Rhythm:regular Rate:Normal     Neuro/Psych    GI/Hepatic   Endo/Other    Renal/GU      Musculoskeletal   Abdominal   Peds  Hematology   Anesthesia Other Findings   Reproductive/Obstetrics                            Anesthesia Physical Anesthesia Plan  ASA: II and emergent  Anesthesia Plan: General   Post-op Pain Management:    Induction: Intravenous, Rapid sequence and Cricoid pressure planned  Airway Management Planned: Oral ETT and Video Laryngoscope Planned  Additional Equipment:   Intra-op Plan:   Post-operative Plan: Extubation in OR  Informed Consent: I have reviewed the patients History and Physical, chart, labs and discussed the procedure including the risks, benefits and alternatives for the proposed anesthesia with the patient or authorized representative who has indicated his/her understanding and acceptance.   Dental Advisory Given and Dental advisory given  Plan Discussed with: CRNA and Surgeon  Anesthesia Plan Comments: (  )        Anesthesia Quick Evaluation

## 2016-03-02 NOTE — Progress Notes (Signed)
Faculty Practice OB/GYN Attending Note (late entry) Called to evaluate patient with IUP at 4246w6d and FHR in the 50s for several minutes, unable to obtain FHR tracing. On my arrival, FHR being visualized on ultrasound, noted to be in 5850s.  Patient was examined by RN, has a closed cervix.  Given persistent bradycardia, stat cesarean section/Code Cesarean activated.  Patient was consented for procedure.  Please refer to operative note for more details.  Jaynie CollinsUGONNA  ANYANWU, MD, FACOG Attending Obstetrician & Gynecologist Faculty Practice, [facility]

## 2016-03-02 NOTE — Op Note (Signed)
Pre-Operative Diagnosis: 1) 33+6 week Intrauterine pregnancy 2) Fetal bradycardia Postoperative Diagnosis: 1) 33+6 week Intrauterine pregnancy 2) Fetal bradycardia Procedure: Emergency Primary Low Transverse Cesarean Section Surgeon: Dr. Jaynie Collins,  Dr. Waynard Reeds Assistant: Webb Silversmith, RNFA Operative Findings: Vigorous infant initially in the vertex presentation, however due to unstable lie the baby was delivered double footling breech. Possible placental abruption. Normal ovaries and tubes. The baby had Apgars of 9 at 1 minute and 9 at 5 minutes and was transferred to the NICU. Specimen: Placenta to pathology EBL: Total I/O In: 1500 [I.V.:1500] Out: 800 [Blood:800]   Procedure:Ms. Blacksher is an 25 year old gravida 2 para 1001 at 17 weeks and 6 days estimated gestational age who presents for cesarean section.  The patient presented to maternity admissions complaining of severe left back pain. She was unable to sit still in order to allow the nursing staff to place her on the external fetal monitor. She received a dose of pain medication. At this time an attempt was made to find heart tones and the nursing staff had a difficult time assessing the fetal heart rate. A stat bedside ultrasound was called for and showed that the fetal heart rate was in the 50s. Dr. Macon Large, the on-site teaching service attending, was called to evaluate the patient. I was on the telephone with the nurse practitioner when Dr. Macon Large arrived and called for a code cesarean due to sustained fetal bradycardia. Her report the patient was verbally consentedAnd brought to the operating room where general anesthesia was administered. She was placed in the dorsal supine position with a leftward tilt. A Betadine splash prep was performed and the scalpel was used to make a Pfannenstiel skin incision which was carried down through the underlying layers of soft tissue to the fascia. The fascia was incised in the midline and  extended laterally with blunt dissection. The rectus muscles were separated in the midline and the abdominal peritoneum was identified, entered, and the incision was extended superiorly and inferiorly. The scalpel was then used to make a low transverse incision in the uterus which was extended laterally. The baby was initially in the vertex presentation however due to active movement and an unstable lie the feet were grasped and the baby was delivered in the double footling breech presentation. The baby was immediately passed to the waiting NICU team. I arrived to the operating room at this time. The placenta was delivered and sent to pathology. I took over the remainder of the surgical case. The uterus was cleared of all clot and debris. The uterine incision was repaired with #1 chromic in a running locked fashion followed by a second imbricating layer. The ovaries and tubes were noted to be normal.  The uterus was then returned to the abdominal cavity and the abdominal cavity was cleared of all clot and debris. The peritoneal incision was not reapproximated. The rectus muscles were reapproximated in the midline with 2-0 Vicryl and a running fashion. The fascia was closed with a looped PDS in a running fashion. The subcutaneous tissue was reapproximated with 20 plain gut interrupted sutures. The skin was closed with 4-0 Vicryl in a subcuticular fashion. All sponge, lap, needle counts were correct 3. The patient did not receive adequate presurgical prophylaxis or have a Foley catheter placed prior to initiating the procedure. Ancef IV antibiotics will be continued for 24 hours after delivery and a Foley catheter was placed prior to the patient having general anesthesia reversed and being extubated. The patient was then  extubated in the operative room and was transferred to the PACU in stable condition.   Following the appropriate informed consent the patient was brought to the operating room where spinal  anesthesia was administered and found to be adequate. She was placed in the dorsal supine position with a leftward tilt. She was prepped and draped in the normal sterile fashion. Scalpel was then used to make a Pfannenstiel skin incision which was carried down to the underlying layers of soft tissue to the fascia. The fascia was incised in the midline and the fascial incision was extended laterally with Mayo scissors. The superior aspect of the fascial incision was grasped with Coker clamps x2, tented up and the rectus muscles dissected off sharply with the electrocautery unit area and the same procedure was repeated on the inferior aspect of the fascial incision. The rectus muscles were separated in the midline. The abdominal peritoneum was identified, tented up, entered sharply, and the incision was extended superiorly and inferiorly with good visualization of the bladder. The Alexis retractor was then deployed. The vesicouterine peritoneum was identified, tented up, entered sharply, and the bladder flap was created digitally. Scalpel was then used to make a low transverse incision on the uterus which was extended laterally with both blunt dissection and the bandage scissors. The fetal vertex was identified, delivered easily through the uterine incision followed by the body. The infant was bulb suctioned on the operative field cried vigorously, cord was clamped and cut and the infant was passed to the waiting neonatologist. Placenta was then delivered spontaneously, the uterus was cleared of all clot and debris. The uterine incision was repaired with #1 chromic in running locked fashion followed by a second imbricating layer. Ovaries and tubes were inspected and normal. The Alexis retractor was removed. The uterus was returned to the abdominal cavity the abdominal cavity was cleared of all clot and debris. The abdominal peritoneum was reapproximated with 2-0 Vicryl in a running fashion, the rectus muscles was  reapproximated with #1 chromic in a running fashion. The fascia was closed with a looped PDS in a running fashion. The skin was closed with 4-0 vicryl in a subcuticular fashion and Dermabon. All sponge lap and needle counts were correct x2. Patient tolerated the procedure well and recovered in stable condition following the procedure.

## 2016-03-02 NOTE — Transfer of Care (Signed)
Immediate Anesthesia Transfer of Care Note  Patient: Lori Mcmahon  Procedure(s) Performed: Procedure(s): CESAREAN SECTION (N/A)  Patient Location: PACU  Anesthesia Type:General  Level of Consciousness: awake, alert  and oriented  Airway & Oxygen Therapy: Patient Spontanous Breathing and Patient connected to nasal cannula oxygen  Post-op Assessment: Report given to RN and Post -op Vital signs reviewed and stable  Post vital signs: Reviewed and stable  Last Vitals:  Filed Vitals:   03/02/16 1530  BP: 141/97  Pulse: 83  Temp: 36.4 C  Resp: 18    Complications: No apparent anesthesia complications

## 2016-03-02 NOTE — Anesthesia Postprocedure Evaluation (Signed)
Anesthesia Post Note  Patient: Lori Mcmahon  Procedure(s) Performed: Procedure(s) (LRB): CESAREAN SECTION (N/A)  Patient location during evaluation: PACU Anesthesia Type: General Level of consciousness: sedated Pain management: satisfactory to patient Vital Signs Assessment: post-procedure vital signs reviewed and stable Respiratory status: spontaneous breathing Cardiovascular status: stable Anesthetic complications: no    Last Vitals:  Filed Vitals:   03/02/16 1800 03/02/16 1845  BP: 130/82   Pulse: 82   Temp:  36.7 C  Resp: 17     Last Pain:  Filed Vitals:   03/02/16 1857  PainSc: 4                  Kariem Wolfson EDWARD

## 2016-03-02 NOTE — MAU Provider Note (Signed)
History     CSN: [id number]  Arrival date and time: 03/02/16 1528   None     Chief Complaint  Patient presents with  . Abdominal Pain  . Back Pain   HPI   Ms.Lori Mcmahon is a 25 y.o. female G2P1001 @ 2w6dpresenting with left sided lower back pain that radiates to the left lower quadrant "this pain is worse than labor".  The pain became severe all of a sudden this morning. The pain presents with her mother and was brought directly back to a room due to the amount of pain she is having.   She was informed this week that her baby has a fetal arrythmia; she will be seeing a specialist for this. She was instructed to keep track of fetal kick counts closely. The patient reports decreased fetal movement yesterday, however good fetal movements today.   The pain in her lower back and left lower quadrant is severe; the pain is sharp and stabbing. The pain is constant. She has never had pain like this before. She denies fever or leaking of amniotic fluid at this time.   OB History    Gravida Para Term Preterm AB TAB SAB Ectopic Multiple Living   2 1 1       1       Past Medical History  Diagnosis Date  . Pregnancy induced hypertension   . Low blood potassium     Past Surgical History  Procedure Laterality Date  . Tonsillectomy    . Knee surgery      Family History  Problem Relation Age of Onset  . Other Mother     Preeclampsia in labor    Social History  Substance Use Topics  . Smoking status: Never Smoker   . Smokeless tobacco: Never Used  . Alcohol Use: No     Comment: stopped 1 1/2 months ago, would smoke 1-2 times a day    Allergies:  Allergies  Allergen Reactions  . Ultram [Tramadol] Hives    Unknown reaction was rushed to ER when she took tramadol    Prescriptions prior to admission  Medication Sig Dispense Refill Last Dose  . acetaminophen (TYLENOL) 325 MG tablet Take 650 mg by mouth daily as needed for pain. Reported on 01/04/2016   Past Month at  Unknown time  . calcium carbonate (TUMS - DOSED IN MG ELEMENTAL CALCIUM) 500 MG chewable tablet Chew 1 tablet by mouth daily as needed for heartburn.   Past Week at Unknown time  . ondansetron (ZOFRAN ODT) 8 MG disintegrating tablet Take 1 tablet (8 mg total) by mouth every 8 (eight) hours as needed for nausea or vomiting. 20 tablet 0   . Prenatal Vit-Fe Fumarate-FA (PRENATAL MULTIVITAMIN) TABS Take 1 tablet by mouth at bedtime.   Past Week at Unknown time  . promethazine (PHENERGAN) 25 MG tablet Take 1 tablet (25 mg total) by mouth every 6 (six) hours as needed for nausea or vomiting. 30 tablet 0    Results for orders placed or performed during the hospital encounter of 03/02/16 (from the past 48 hour(s))  CBC     Status: Abnormal   Collection Time: 03/02/16  4:02 PM  Result Value Ref Range   WBC 16.6 (H) 4.0 - 10.5 K/uL   RBC 3.92 3.87 - 5.11 MIL/uL   Hemoglobin 12.1 12.0 - 15.0 g/dL   HCT 35.1 (L) 36.0 - 46.0 %   MCV 89.5 78.0 - 100.0 fL   MCH 30.9 26.0 -  34.0 pg   MCHC 34.5 30.0 - 36.0 g/dL   RDW 12.9 11.5 - 15.5 %   Platelets 157 150 - 400 K/uL  Comprehensive metabolic panel     Status: Abnormal   Collection Time: 03/02/16  4:02 PM  Result Value Ref Range   Sodium 139 135 - 145 mmol/L   Potassium 3.1 (L) 3.5 - 5.1 mmol/L   Chloride 108 101 - 111 mmol/L   CO2 19 (L) 22 - 32 mmol/L   Glucose, Bld 83 65 - 99 mg/dL   BUN 7 6 - 20 mg/dL   Creatinine, Ser 0.57 0.44 - 1.00 mg/dL   Calcium 8.2 (L) 8.9 - 10.3 mg/dL   Total Protein 6.7 6.5 - 8.1 g/dL   Albumin 3.2 (L) 3.5 - 5.0 g/dL   AST 11 (L) 15 - 41 U/L   ALT 13 (L) 14 - 54 U/L   Alkaline Phosphatase 102 38 - 126 U/L   Total Bilirubin 0.2 (L) 0.3 - 1.2 mg/dL   GFR calc non Af Amer >60 >60 mL/min   GFR calc Af Amer >60 >60 mL/min    Comment: (NOTE) The eGFR has been calculated using the CKD EPI equation. This calculation has not been validated in all clinical situations. eGFR's persistently <60 mL/min signify possible Chronic  Kidney Disease.    Anion gap 12 5 - 15  Type and screen [place]     Status: None   Collection Time: 03/02/16  4:02 PM  Result Value Ref Range   ABO/RH(D) O POS    Antibody Screen NEG    Sample Expiration 03/05/2016     Review of Systems  Constitutional: Negative for fever.  Gastrointestinal: Positive for nausea and abdominal pain. Negative for vomiting, diarrhea and constipation.  Genitourinary: Positive for urgency and frequency.   Physical Exam   Blood pressure 130/82, pulse 82, temperature 97.7 F (36.5 C), resp. rate 17, last menstrual period 07/09/2015, SpO2 100 %, unknown if currently breastfeeding.  Physical Exam  Constitutional: She is oriented to person, place, and time. She appears well-developed. She appears distressed.  Respiratory: Effort normal.  GI: Soft. Normal appearance. There is tenderness in the periumbilical area, suprapubic area and left lower quadrant. There is rigidity and guarding. There is no rebound.  Genitourinary:  Cervix: Closed, posterior   Neurological: She is alert and oriented to person, place, and time.  Skin: Skin is warm. She is diaphoretic.    MAU Course  Procedures  None  MDM  Abdominal exam limited due to patients discomfort.  CMP CBC UA LR bolus Fentanyl 50 mcg IM ordered at 1545 1550: Dr. Harrington Challenger notified of patients arrival> informed her of patients condition including HPI> unable to document fetal heart tones via external monitor due to patients pain/intolerance. Stat bedside US ordered.  RN reports + fetal heart tones via doppler of 130's. 1556: Bedside US at bedside> reviewed [place] at bedside with [place] tech who reports the fetal HR is currently in the 50's. I instructed the RN staff to notify Dr. Harolyn Rutherford with Faculty practice while I contact Dr. Harrington Challenger. Informed Dr. Harrington Challenger of [place] with reports by the [place] tech that fetal heart rate is in the 50's. Dr. Harrington Challenger on her way to the hospital.  1605: STAT, Code cesarean called  by Dr. Harolyn Rutherford.   Assessment and Plan   A:  1. Fetal bradycardia   2. Sudden onset of severe abdominal pain     P:  Code cesarean- stat  C section.   Lezlie Lye, NP 03/02/2016 6:19 PM

## 2016-03-03 ENCOUNTER — Encounter (HOSPITAL_COMMUNITY): Payer: Self-pay | Admitting: *Deleted

## 2016-03-03 LAB — CBC
HCT: 30.6 % — ABNORMAL LOW (ref 36.0–46.0)
Hemoglobin: 10.3 g/dL — ABNORMAL LOW (ref 12.0–15.0)
MCH: 30.3 pg (ref 26.0–34.0)
MCHC: 33.7 g/dL (ref 30.0–36.0)
MCV: 90 fL (ref 78.0–100.0)
PLATELETS: 127 10*3/uL — AB (ref 150–400)
RBC: 3.4 MIL/uL — ABNORMAL LOW (ref 3.87–5.11)
RDW: 13 % (ref 11.5–15.5)
WBC: 14.7 10*3/uL — ABNORMAL HIGH (ref 4.0–10.5)

## 2016-03-03 MED ORDER — OXYCODONE HCL 5 MG PO TABS
5.0000 mg | ORAL_TABLET | ORAL | Status: DC | PRN
  Administered 2016-03-04: 5 mg via ORAL
  Filled 2016-03-03: qty 1

## 2016-03-03 MED ORDER — OXYCODONE-ACETAMINOPHEN 5-325 MG PO TABS
1.0000 | ORAL_TABLET | ORAL | Status: DC | PRN
Start: 2016-03-03 — End: 2016-03-06
  Administered 2016-03-03 – 2016-03-06 (×14): 2 via ORAL
  Filled 2016-03-03 (×14): qty 2

## 2016-03-03 MED ORDER — OXYCODONE HCL 5 MG PO TABS: 10.0000 mg | ORAL_TABLET | ORAL | Status: DC | PRN

## 2016-03-03 NOTE — Progress Notes (Signed)
Subjective: Postpartum Day 1: Cesarean Delivery Patient reports pain controlled, ambulating without difficulty, tolerating po  Objective: Vital signs in last 24 hours: Temp:  [97.5 F (36.4 C)-98.8 F (37.1 C)] 98.4 F (36.9 C) (03/19 1025) Pulse Rate:  [64-85] 77 (03/19 1025) Resp:  [16-20] 18 (03/19 1025) BP: (125-141)/(61-97) 125/61 mmHg (03/19 1025) SpO2:  [97 %-100 %] 100 % (03/19 1025)  Physical Exam:  General: alert, cooperative and appears stated age 25: appropriate Uterine Fundus: firm Incision: healing well DVT Evaluation: No evidence of DVT seen on physical exam.  Results for orders placed or performed during the hospital encounter of 03/02/16 (from the past 24 hour(s))  CBC     Status: Abnormal   Collection Time: 03/02/16  4:02 PM  Result Value Ref Range   WBC 16.6 (H) 4.0 - 10.5 K/uL   RBC 3.92 3.87 - 5.11 MIL/uL   Hemoglobin 12.1 12.0 - 15.0 g/dL   HCT 96.0 (L) 45.4 - 09.8 %   MCV 89.5 78.0 - 100.0 fL   MCH 30.9 26.0 - 34.0 pg   MCHC 34.5 30.0 - 36.0 g/dL   RDW 11.9 14.7 - 82.9 %   Platelets 157 150 - 400 K/uL  Comprehensive metabolic panel     Status: Abnormal   Collection Time: 03/02/16  4:02 PM  Result Value Ref Range   Sodium 139 135 - 145 mmol/L   Potassium 3.1 (L) 3.5 - 5.1 mmol/L   Chloride 108 101 - 111 mmol/L   CO2 19 (L) 22 - 32 mmol/L   Glucose, Bld 83 65 - 99 mg/dL   BUN 7 6 - 20 mg/dL   Creatinine, Ser 5.62 0.44 - 1.00 mg/dL   Calcium 8.2 (L) 8.9 - 10.3 mg/dL   Total Protein 6.7 6.5 - 8.1 g/dL   Albumin 3.2 (L) 3.5 - 5.0 g/dL   AST 11 (L) 15 - 41 U/L   ALT 13 (L) 14 - 54 U/L   Alkaline Phosphatase 102 38 - 126 U/L   Total Bilirubin 0.2 (L) 0.3 - 1.2 mg/dL   GFR calc non Af Amer >60 >60 mL/min   GFR calc Af Amer >60 >60 mL/min   Anion gap 12 5 - 15  Type and screen [facility]      Status: None   Collection Time: 03/02/16  4:02 PM  Result Value Ref Range   ABO/RH(D) O POS    Antibody Screen NEG    Sample  Expiration 03/05/2016   CBC     Status: Abnormal   Collection Time: 03/02/16 10:04 PM  Result Value Ref Range   WBC 21.5 (H) 4.0 - 10.5 K/uL   RBC 3.55 (L) 3.87 - 5.11 MIL/uL   Hemoglobin 10.7 (L) 12.0 - 15.0 g/dL   HCT 13.0 (L) 86.5 - 78.4 %   MCV 90.4 78.0 - 100.0 fL   MCH 30.1 26.0 - 34.0 pg   MCHC 33.3 30.0 - 36.0 g/dL   RDW 69.6 29.5 - 28.4 %   Platelets 147 (L) 150 - 400 K/uL  CBC     Status: Abnormal   Collection Time: 03/03/16  5:24 AM  Result Value Ref Range   WBC 14.7 (H) 4.0 - 10.5 K/uL   RBC 3.40 (L) 3.87 - 5.11 MIL/uL   Hemoglobin 10.3 (L) 12.0 - 15.0 g/dL   HCT 13.2 (L) 44.0 - 10.2 %   MCV 90.0 78.0 - 100.0 fL   MCH 30.3 26.0 - 34.0 pg  MCHC 33.7 30.0 - 36.0 g/dL   RDW 95.213.0 84.111.5 - 32.415.5 %   Platelets 127 (L) 150 - 400 K/uL      Recent Labs  03/02/16 2204 03/03/16 0524  HGB 10.7* 10.3*  HCT 32.1* 30.6*    Assessment/Plan: Status post Cesarean section. Doing well postoperatively.  Continue current care.  Eian Vandervelden H. 03/03/2016, 12:52 PM

## 2016-03-03 NOTE — Addendum Note (Signed)
Addendum  created 03/03/16 [id number] by Graciela HusbandsWynn O Ambree Frances, CRNA   Modules edited: Notes Section   Notes Section:  File: [id number]

## 2016-03-03 NOTE — Lactation Note (Signed)
This note was copied from a baby's chart. Lactation Consultation Note  Patient Name: [name] WGNFA'OToday's Date: 03/03/2016 Reason for consult: Initial assessment;NICU baby Infant is 20 hrs old & [facility]. Baby was born @ 1854w6d & weighed 4+5.1# at birth. Baby is in the NICU. Mom reported that she had pumped and got ~1oz. Mom reports that she BF her 1st child for 2.5 yrs but that she had a hard time pumping so is forcing herself to pump for this baby since she can't BF yet. Reinforced importance of pumping q 3hrs (4-5hrs at night) on initiate setting until more milk volume is seen. Also encouraged mom to do skin-to-skin & BF when the NICU staff say it is ok. Mom got a little teary after saying that she hasn't been able to hold her yet. Encouraged mom to ensure she is getting rest and looking after herself as well. Nurse indicated that there might be some stress surrounding FOB and wanted LC to encourage mom to de-stress when she can; LC stated that stress can inhibit a let down so that she should aim to de-stress when able. Mom reports no questions at this time. [facility]our[facility] if she has further questions later.  Maternal Data Does the patient have breastfeeding experience prior to this delivery?: Yes  Feeding    LATCH Score/Interventions                      Lactation Tools Discussed/Used WIC Program: Yes   Consult Status Consult Status: Follow-up Date: 03/04/16 Follow-up type: In-patient    Oneal GroutLaura C Yecheskel Kurek 03/03/2016, 1:35 PM
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
## 2016-03-03 NOTE — Progress Notes (Signed)
Admission nutrition screen triggered for unintentional weight loss > 10 lbs within the last month. . Patients chart reviewed and assessed  for nutritional risk. Patient is determined to be at low nutrition  risk.    Baltasar Twilley M.Ed. R.D. LDN Neonatal Nutrition Support Specialist/RD III Pager [phone]      Phone [phone]  

## 2016-03-03 NOTE — Anesthesia Postprocedure Evaluation (Signed)
Anesthesia Post Note  Patient: Lori Mcmahon  Procedure(s) Performed: Procedure(s) (LRB): CESAREAN SECTION (N/A)  Patient location during evaluation: [facility] Anesthesia Type: General Level of consciousness: awake and alert Pain management: satisfactory to patient Vital Signs Assessment: post-procedure vital signs reviewed and stable Respiratory status: spontaneous breathing and respiratory function stable Cardiovascular status: stable Postop Assessment: adequate PO intake Anesthetic complications: no    Last Vitals:  Filed Vitals:   03/03/16 0402 03/03/16 0538  BP: 127/76 137/76  Pulse: 66 75  Temp: 36.7 C 36.6 C  Resp: 18 20    Last Pain:  Filed Vitals:   03/03/16 0935  PainSc: 5                  Dushawn Pusey

## 2016-03-03 NOTE — Progress Notes (Signed)
CSW completed Psychosocial Assessment due to baby's admission to NICU and positive drug screen in pregnancy for THC and Benzodiazepines.  Full documentation to follow. 

## 2016-03-04 ENCOUNTER — Encounter (HOSPITAL_COMMUNITY): Payer: Self-pay | Admitting: Obstetrics and Gynecology

## 2016-03-04 NOTE — Progress Notes (Signed)
Patient is doing well.  She is ambulating, voiding, tolerating PO.  Lochia is appropriate.  Reports cont pain w ambulation.   Filed Vitals:   03/03/16 1024 03/03/16 1025 03/03/16 2156 03/04/16 0620  BP:  125/61 129/78 122/77  Pulse:  77 80 88  Temp:  98.4 F (36.9 C) 98 F (36.7 C) 97.8 F (36.6 C)  TempSrc:  Oral Oral Oral  Resp: 16 18 20 15   SpO2: 100% 100% 100% 99%    NAD Fundus firm, incision c/d/i, no erythema or drainage Ext: no edema  Lab Results  Component Value Date   WBC 14.7* 03/03/2016   HGB 10.3* 03/03/2016   HCT 30.6* 03/03/2016   MCV 90.0 03/03/2016   PLT 127* 03/03/2016    --/--/O POS (03/18 1602)/RImmune  A/P 24 y.o. [id number] POD#2 s/p urgent c/s at 8065w6d for fetal bradycardia. Routine care.   Pain controlled--reviewed goals/expectations.  Will add additional 5mg  oxycodone prn for severe breakthrough pain   Daquawn Seelman GEFFEL Hebe Merriwether

## 2016-03-04 NOTE — Lactation Note (Signed)
This note was copied from a baby's chart. Lactation Consultation Note  Patient Name: Lori Mcmahon WUJWJ'XToday's Date: 03/04/2016 Reason for consult: Follow-up assessment;NICU baby  NICU baby 7545 hours old. Mom in wheelchair and ready to leave room for NICU. Mom states that she has not pumped today, but is glad this LC reminded her that she needs to. Enc pumping 8 times/24 hours for 15 minutes followed by hand expression. Discussed using the pump to replace what the baby would be doing at the breast. Mom requested breast pads and they were given with review. Enc mom to call for assistance as needed.  Maternal Data    Feeding    LATCH Score/Interventions                      Lactation Tools Discussed/Used     Consult Status Consult Status: Follow-up Date: 03/05/16 Follow-up type: In-patient    [name], [name] 03/04/2016, 2:10 PM

## 2016-03-05 ENCOUNTER — Ambulatory Visit (HOSPITAL_COMMUNITY): Payer: Medicaid Other

## 2016-03-05 NOTE — Progress Notes (Signed)
  Patient is eating, ambulating, voiding.  Pain control is good.  Filed Vitals:   03/04/16 1219 03/04/16 1714 03/04/16 2137 03/05/16 0537  BP: 113/74 116/66 117/69 118/69  Pulse: 69 79 76 65  Temp: 98.4 F (36.9 C) 97.6 F (36.4 C) 98.1 F (36.7 C) 97.3 F (36.3 C)  TempSrc: Oral Oral Oral Oral  Resp: 18 18 16 20   SpO2: 100% 100% 100% 100%    lungs:   clear to auscultation cor:    RRR Abdomen:  soft, appropriate tenderness, incisions intact and without erythema or exudate ex:    no cords   Lab Results  Component Value Date   WBC 14.7* 03/03/2016   HGB 10.3* 03/03/2016   HCT 30.6* 03/03/2016   MCV 90.0 03/03/2016   PLT 127* 03/03/2016    --/--/O POS (03/18 1602)/RI  A/P    Post operative day 3.  Routine post op and postpartum care.  Expect d/c tomorrow.  Percocet for pain control. Pt desires to stay one more day to be close to baby in NICU- baby is doing better.

## 2016-03-05 NOTE — Lactation Note (Signed)
This note was copied from a baby's chart. Lactation Consultation Note  Follow up visit made.  Mom is pumping every 3 hours and obtaining 20+ each pumping.  She has changed setting to standard.  [place]  [facility].  Breasts comfortable at present.  Encouraged to call with questions/concerns prn.  Patient Name: Lori Delories HeinzHaylie Igoe WUJWJ'XToday's Date: 03/05/2016     Maternal Data    Feeding Feeding Type: Breast Milk Length of feed: 45 min  LATCH Score/Interventions                      Lactation Tools Discussed/Used     Consult Status      Huston FoleyMOULDEN, Biannca Scantlin S 03/05/2016, 5:30 PM

## 2016-03-06 ENCOUNTER — Encounter (HOSPITAL_COMMUNITY): Payer: Self-pay | Admitting: *Deleted

## 2016-03-06 MED ORDER — OXYCODONE HCL 5 MG PO TABS: 5.0000 mg | ORAL_TABLET | ORAL | Status: DC | PRN

## 2016-03-06 NOTE — Progress Notes (Signed)
Discharge instructions & prescription given - pt d/c'd home

## 2016-03-06 NOTE — Progress Notes (Signed)
CSW received message from baby's bedside RN that MOB is requesting gas cards.  CSW provided MOB with 2 $10 cards.  MOB inquired about assistance with baby basics and informed CSW today that all she has at home is a bed for baby.  CSW will make referral to [facility].  MOB was very appreciative and seems to be in good spirits today as she prepares for discharge.  She states no other questions or needs at this time.

## 2016-03-06 NOTE — Discharge Summary (Signed)
Obstetric Discharge Summary Reason for Admission: cesarean section and for fetal bradycardia discovered during evaluation for ctx in MAU Prenatal Procedures: ultrasound Intrapartum Procedures: cesarean: low cervical, transverse Postpartum Procedures: none Complications-Operative and Postpartum: none HEMOGLOBIN  Date Value Ref Range Status  03/03/2016 10.3* 12.0 - 15.0 g/dL Final  11/91/478202/24/2014 95.611.5 g/dL Final   HCT  Date Value Ref Range Status  03/03/2016 30.6* 36.0 - 46.0 % Final    Physical Exam:  General: alert and cooperative Lochia: appropriate Uterine Fundus: firm Incision: healing well, no significant drainage DVT Evaluation: No evidence of DVT seen on physical exam.  Discharge Diagnoses: Preterm fetal bradycardia  Discharge Information: Date: 03/06/2016 Activity: pelvic rest Diet: routine Medications: PNV, Ibuprofen and Percocet Condition: stable Instructions: refer to practice specific booklet Discharge to: home Follow-up Information    Follow up with Lori HerculesOSS,[name], MD In 2 weeks.   Specialty:  Obstetrics and Gynecology   Contact information:   [address] [place] [id number] [phone]       Newborn Data: Live born female  Birth Weight: 4 lb 5.1 oz (1960 g) APGAR: 9, 9  Baby in NICU at time of mother's discharge.  Lori Mcmahon 03/06/2016, 11:29 AM

## 2016-03-06 NOTE — Lactation Note (Signed)
This note was copied from a baby'Mcmahon chart. Lactation Consultation Note  Follow up visit made.  Mom is pumping 30 mls every 3 hours.  No c/o or concerns.  [facility] and has an appointment to pick up a loaner pump.  Reassured we will continue to follow with baby in NICU.  Encouraged to call with questions.  Patient Name: Lori Mcmahon MWUXL'KToday'Mcmahon Date: 03/06/2016     Maternal Data    Feeding    LATCH Score/Interventions                      Lactation Tools Discussed/Used     Consult Status      Huston FoleyMOULDEN, Lori Mcmahon 03/06/2016, 10:04 AM

## 2016-03-08 ENCOUNTER — Encounter (HOSPITAL_COMMUNITY): Payer: Self-pay

## 2016-03-08 ENCOUNTER — Inpatient Hospital Stay (HOSPITAL_COMMUNITY)
Admission: AD | Admit: 2016-03-08 | Discharge: 2016-03-08 | Disposition: A | Discharge status: Home / Self Care | Payer: Medicaid Other | Source: Ambulatory Visit | Attending: Obstetrics and Gynecology | Admitting: Obstetrics and Gynecology

## 2016-03-08 DIAGNOSIS — G8918 Other acute postprocedural pain: Secondary | ICD-10-CM | POA: Insufficient documentation

## 2016-03-08 DIAGNOSIS — M545 Low back pain, unspecified: Secondary | ICD-10-CM

## 2016-03-08 DIAGNOSIS — Z888 Allergy status to other drugs, medicaments and biological substances status: Secondary | ICD-10-CM | POA: Insufficient documentation

## 2016-03-08 DIAGNOSIS — M549 Dorsalgia, unspecified: Secondary | ICD-10-CM | POA: Diagnosis present

## 2016-03-08 LAB — URINALYSIS, ROUTINE W REFLEX MICROSCOPIC
BILIRUBIN URINE: NEGATIVE
Glucose, UA: NEGATIVE mg/dL
Ketones, ur: NEGATIVE mg/dL
NITRITE: NEGATIVE
PH: 6 (ref 5.0–8.0)
Protein, ur: NEGATIVE mg/dL
SPECIFIC GRAVITY, URINE: 1.01 (ref 1.005–1.030)

## 2016-03-08 LAB — CBC
HCT: 32 % — ABNORMAL LOW (ref 36.0–46.0)
HEMOGLOBIN: 10.9 g/dL — AB (ref 12.0–15.0)
MCH: 30.1 pg (ref 26.0–34.0)
MCHC: 34.1 g/dL (ref 30.0–36.0)
MCV: 88.4 fL (ref 78.0–100.0)
PLATELETS: 208 10*3/uL (ref 150–400)
RBC: 3.62 MIL/uL — AB (ref 3.87–5.11)
RDW: 12.6 % (ref 11.5–15.5)
WBC: 13.8 10*3/uL — AB (ref 4.0–10.5)

## 2016-03-08 LAB — URINE MICROSCOPIC-ADD ON

## 2016-03-08 MED ORDER — KETOROLAC TROMETHAMINE 60 MG/2ML IM SOLN
60.0000 mg | Freq: Once | INTRAMUSCULAR | Status: AC
  Administered 2016-03-08: 60 mg via INTRAMUSCULAR
  Filled 2016-03-08: qty 2

## 2016-03-08 MED ORDER — IBUPROFEN 800 MG PO TABS: 800.0000 mg | ORAL_TABLET | Freq: Three times a day (TID) | ORAL | Status: DC | PRN

## 2016-03-08 NOTE — MAU Note (Signed)
Patient presents with c/o lower back pain that started this morning. Patient rates pain 9/10

## 2016-03-08 NOTE — MAU Provider Note (Signed)
History     CSN: [id number]  Arrival date and time: 03/08/16 [id number]   First Provider Initiated Contact with Patient 03/08/16 0128      Chief Complaint  Patient presents with  . Back Pain   Back Pain This is a new problem. The current episode started today. The problem occurs constantly. The problem is unchanged. The pain is present in the lumbar spine. Quality: throbbing  The pain is at a severity of 8/10. Pertinent negatives include no abdominal pain, dysuria or fever. Risk factors: postpartum  Treatments tried: tylenol, percocet  The treatment provided mild relief.    Past Medical History  Diagnosis Date  . Pregnancy induced hypertension   . Low blood potassium     Past Surgical History  Procedure Laterality Date  . Tonsillectomy    . Knee surgery    . Cesarean section N/A 03/02/2016    Procedure: CESAREAN SECTION;  Surgeon: Waynard ReedsKendra Ross, MD;  Location: [facility];  Service: Obstetrics;  Laterality: N/A;    Family History  Problem Relation Age of Onset  . Other Mother     Preeclampsia in labor    Social History  Substance Use Topics  . Smoking status: Never Smoker   . Smokeless tobacco: Never Used  . Alcohol Use: No     Comment: stopped 1 1/2 months ago, would smoke 1-2 times a day    Allergies:  Allergies  Allergen Reactions  . Ultram [Tramadol] Hives    Unknown reaction was rushed to ER when she took tramadol    Prescriptions prior to admission  Medication Sig Dispense Refill Last Dose  . acetaminophen (TYLENOL) 325 MG tablet Take 650 mg by mouth daily as needed for pain. Reported on 01/04/2016   Past Month at Unknown time  . calcium carbonate (TUMS - DOSED IN MG ELEMENTAL CALCIUM) 500 MG chewable tablet Chew 1 tablet by mouth daily as needed for heartburn.   Past Week at Unknown time  . oxyCODONE (OXY IR/ROXICODONE) 5 MG immediate release tablet Take 1 tablet (5 mg total) by mouth every 4 (four) hours as needed for breakthrough pain. 30 tablet 0   . Prenatal  Vit-Fe Fumarate-FA (PRENATAL MULTIVITAMIN) TABS Take 1 tablet by mouth at bedtime.   03/02/2016 at Unknown time    Review of Systems  Constitutional: Negative for fever and chills.  Gastrointestinal: Positive for nausea. Negative for vomiting, abdominal pain, diarrhea and constipation.  Genitourinary: Negative for dysuria, urgency and frequency.  Musculoskeletal: Positive for back pain.   Physical Exam   Blood pressure 139/87, pulse 89, temperature 98.3 F (36.8 C), temperature source Oral, resp. rate 18, unknown if currently breastfeeding.  Physical Exam  Nursing note and vitals reviewed. Constitutional: She is oriented to person, place, and time. She appears well-developed and well-nourished. No distress.  HENT:  Head: Normocephalic.  Cardiovascular: Normal rate.   Respiratory: Effort normal.  GI: Soft. There is no tenderness. There is no rebound.  honeycomb dressing removed. Incision: C/D/I  Genitourinary:  No CVA tenderness   Neurological: She is alert and oriented to person, place, and time.  Skin: Skin is warm and dry.  Psychiatric: She has a normal mood and affect.   Results for orders placed or performed during the hospital encounter of 03/08/16 (from the past 24 hour(s))  Urinalysis, Routine w reflex microscopic (not [facility])     Status: Abnormal   Collection Time: 03/08/16 12:41 AM  Result Value Ref Range   Color, Urine YELLOW YELLOW  APPearance CLEAR CLEAR   Specific Gravity, Urine 1.010 1.005 - 1.030   pH 6.0 5.0 - 8.0   Glucose, UA NEGATIVE NEGATIVE mg/dL   Hgb urine dipstick SMALL (A) NEGATIVE   Bilirubin Urine NEGATIVE NEGATIVE   Ketones, ur NEGATIVE NEGATIVE mg/dL   Protein, ur NEGATIVE NEGATIVE mg/dL   Nitrite NEGATIVE NEGATIVE   Leukocytes, UA SMALL (A) NEGATIVE  Urine microscopic-add on     Status: Abnormal   Collection Time: 03/08/16 12:41 AM  Result Value Ref Range   Squamous Epithelial / LPF 0-5 (A) NONE SEEN   WBC, UA 0-5 0 - 5 WBC/hpf   RBC /  HPF 0-5 0 - 5 RBC/hpf   Bacteria, UA FEW (A) NONE SEEN  CBC     Status: Abnormal   Collection Time: 03/08/16  1:55 AM  Result Value Ref Range   WBC 13.8 (H) 4.0 - 10.5 K/uL   RBC 3.62 (L) 3.87 - 5.11 MIL/uL   Hemoglobin 10.9 (L) 12.0 - 15.0 g/dL   HCT 16.1 (L) 09.6 - 04.5 %   MCV 88.4 78.0 - 100.0 fL   MCH 30.1 26.0 - 34.0 pg   MCHC 34.1 30.0 - 36.0 g/dL   RDW 40.9 81.1 - 91.4 %   Platelets 208 150 - 400 K/uL     MAU Course  Procedures  MDM 0239: Patient has had toradol. She reports her pain is now 3/10   Assessment and Plan   1. Postoperative pain   2. Left-sided low back pain without sciatica    DC home Comfort measures reviewed  RX: ibuprofen  PRN #30  Return to MAU as needed FU with OB as planned  Follow-up Information    Follow up with [facility].   Specialty:  Obstetrics and Gynecology   Why:  As scheduled   Contact information:   [address] [place] [address] [phone]        Tawnya Crook 03/08/2016, 1:34 AM

## 2016-03-08 NOTE — Discharge Instructions (Signed)

## 2016-11-21 ENCOUNTER — Encounter: Payer: Medicaid Other | Admitting: Certified Nurse Midwife

## 2016-12-10 ENCOUNTER — Encounter: Payer: Self-pay | Admitting: Obstetrics

## 2017-01-01 ENCOUNTER — Encounter: Payer: Self-pay | Admitting: Obstetrics and Gynecology

## 2017-01-16 DIAGNOSIS — Z3490 Encounter for supervision of normal pregnancy, unspecified, unspecified trimester: Secondary | ICD-10-CM | POA: Insufficient documentation

## 2017-01-17 ENCOUNTER — Encounter: Payer: Self-pay | Admitting: Advanced Practice Midwife

## 2017-01-24 ENCOUNTER — Encounter: Payer: Self-pay | Admitting: Certified Nurse Midwife

## 2017-02-10 ENCOUNTER — Ambulatory Visit (INDEPENDENT_AMBULATORY_CARE_PROVIDER_SITE_OTHER): Payer: Medicaid Other | Admitting: Certified Nurse Midwife

## 2017-02-10 ENCOUNTER — Encounter: Payer: Self-pay | Admitting: Certified Nurse Midwife

## 2017-02-10 VITALS — BP 111/72 | HR 70 | Wt 211.0 lb

## 2017-02-10 DIAGNOSIS — O0932 Supervision of pregnancy with insufficient antenatal care, second trimester: Secondary | ICD-10-CM | POA: Diagnosis not present

## 2017-02-10 DIAGNOSIS — Z113 Encounter for screening for infections with a predominantly sexual mode of transmission: Secondary | ICD-10-CM | POA: Diagnosis not present

## 2017-02-10 DIAGNOSIS — O09292 Supervision of pregnancy with other poor reproductive or obstetric history, second trimester: Secondary | ICD-10-CM | POA: Diagnosis not present

## 2017-02-10 DIAGNOSIS — Z98891 History of uterine scar from previous surgery: Secondary | ICD-10-CM

## 2017-02-10 DIAGNOSIS — O09299 Supervision of pregnancy with other poor reproductive or obstetric history, unspecified trimester: Secondary | ICD-10-CM

## 2017-02-10 DIAGNOSIS — Z124 Encounter for screening for malignant neoplasm of cervix: Secondary | ICD-10-CM | POA: Diagnosis not present

## 2017-02-10 DIAGNOSIS — O219 Vomiting of pregnancy, unspecified: Secondary | ICD-10-CM | POA: Diagnosis not present

## 2017-02-10 DIAGNOSIS — O34219 Maternal care for unspecified type scar from previous cesarean delivery: Secondary | ICD-10-CM

## 2017-02-10 DIAGNOSIS — O0930 Supervision of pregnancy with insufficient antenatal care, unspecified trimester: Secondary | ICD-10-CM

## 2017-02-10 DIAGNOSIS — Z3480 Encounter for supervision of other normal pregnancy, unspecified trimester: Secondary | ICD-10-CM

## 2017-02-10 MED ORDER — ONDANSETRON HCL 8 MG PO TABS: 8.0000 mg | ORAL_TABLET | Freq: Three times a day (TID) | ORAL | 2 refills | Status: DC | PRN

## 2017-02-10 MED ORDER — DOXYLAMINE-PYRIDOXINE 10-10 MG PO TBEC: DELAYED_RELEASE_TABLET | ORAL | 4 refills | Status: DC

## 2017-02-10 MED ORDER — PRENATE PIXIE 10-0.6-0.4-200 MG PO CAPS: 1.0000 | ORAL_CAPSULE | Freq: Every day | ORAL | 12 refills | Status: DC

## 2017-02-10 NOTE — Progress Notes (Signed)
Subjective:    Lori Mcmahon is being seen today for her first obstetrical visit.  This is not a planned pregnancy, was on Depo injections. She is at 6672w3d gestation. Her obstetrical history is significant for obesity, pre-eclampsia and previous emergency C-section, fetal bradycardia. Relationship with FOB: significant other, living together. Patient does intend to breast feed. Pregnancy history fully reviewed. Reports using THC.  Reports daily N&V.   The information documented in the HPI was reviewed and verified.  Menstrual History: OB History    Gravida Para Term Preterm AB Living   3 2 1 1   2    SAB TAB Ectopic Multiple Live Births         0 2       Patient's last menstrual period was 08/30/2016.    Past Medical History:  Diagnosis Date  . Low blood potassium   . Pregnancy induced hypertension     Past Surgical History:  Procedure Laterality Date  . CESAREAN SECTION N/A 03/02/2016   Procedure: CESAREAN SECTION;  Surgeon: Waynard ReedsKendra Ross, MD;  Location: [facility];  Service: Obstetrics;  Laterality: N/A;  . KNEE SURGERY    . TONSILLECTOMY       (Not in a hospital admission) Allergies  Allergen Reactions  . Ultram [Tramadol] Hives    Unknown reaction was rushed to ER when she took tramadol    Social History  Substance Use Topics  . Smoking status: Never Smoker  . Smokeless tobacco: Never Used  . Alcohol use No     Comment: stopped 1 1/2 months ago, would smoke 1-2 times a day    Family History  Problem Relation Age of Onset  . Other Mother     Preeclampsia in labor     Review of Systems Constitutional: negative for weight loss Gastrointestinal: + for vomiting Genitourinary:negative for genital lesions and vaginal discharge and dysuria Musculoskeletal:negative for back pain Behavioral/Psych: negative for abusive relationship, depression, illegal drug usage and tobacco use    Objective:    BP 111/72   Pulse 70   Wt 211 lb (95.7 kg)   LMP 08/30/2016   BMI 29.43  kg/m  General Appearance:    Alert, cooperative, no distress, appears stated age  Head:    Normocephalic, without obvious abnormality, atraumatic  Eyes:    PERRL, conjunctiva/corneas clear, EOM's intact, fundi    benign, both eyes  Ears:    Normal TM's and external ear canals, both ears  Nose:   Nares normal, septum midline, mucosa normal, no drainage    or sinus tenderness  Throat:   Lips, mucosa, and tongue normal; teeth and gums normal  Neck:   Supple, symmetrical, trachea midline, no adenopathy;    thyroid:  no enlargement/tenderness/nodules; no carotid   bruit or JVD  Back:     Symmetric, no curvature, ROM normal, no CVA tenderness  Lungs:     Clear to auscultation bilaterally, respirations unlabored  Chest Wall:    No tenderness or deformity   Heart:    Regular rate and rhythm, S1 and S2 normal, no murmur, rub   or gallop  Breast Exam:    No tenderness, masses, or nipple abnormality  Abdomen:     Soft, non-tender, bowel sounds active all four quadrants,    no masses, no organomegaly  Genitalia:    Normal female without lesion, discharge or tenderness  Extremities:   Extremities normal, atraumatic, no cyanosis or edema  Pulses:   2+ and symmetric all extremities  Skin:  Skin color, texture, turgor normal, no rashes or lesions  Lymph nodes:   Cervical, supraclavicular, and axillary nodes normal  Neurologic:   CNII-XII intact, normal strength, sensation and reflexes    throughout          Cervix:  Long, thick, closed and posterior.  FHR: 136 by doppler.  FH: 24 cm    Lab Review Urine pregnancy test Labs reviewed yes Radiologic studies reviewed yes Assessment:    Pregnancy at [redacted]w[redacted]d weeks   Supervision of other normal pregnancy, antepartum - Plan: VITAMIN D 25 Hydroxy (Vit-D Deficiency, Fractures), Varicella zoster antibody, IgG, Hemoglobinopathy evaluation, Culture, OB Urine, MaterniT21 PLUS Core+SCA, Cystic Fibrosis Mutation 97, Obstetric Panel, Including HIV, Cytology - PAP,  Cervicovaginal ancillary only, Hemoglobin A1c, POCT urinalysis dipstick, ToxASSURE Select 13 (MW), Urine, [place] MFM OB COMP + 14 WK, Prenat-FeAsp-Meth-FA-DHA w/o A (PRENATE PIXIE) 10-0.6-0.4-200 MG CAPS  Hx of preeclampsia, prior pregnancy, currently pregnant - Plan: Protein / creatinine ratio, urine, [place] MFM OB COMP + 14 WK  Nausea and vomiting during pregnancy - Plan: Doxylamine-Pyridoxine (DICLEGIS) 10-10 MG TBEC, ondansetron (ZOFRAN) 8 MG tablet  Late prenatal care - Plan: [place] MFM OB COMP + 14 WK  History of cesarean section   Plan:      Prenatal vitamins.  Counseling provided regarding continued use of seat belts, cessation of alcohol consumption, smoking or use of illicit drugs; infection precautions i.e., influenza/TDAP immunizations, toxoplasmosis,CMV, parvovirus, listeria and varicella; workplace safety, exercise during pregnancy; routine dental care, safe medications, sexual activity, hot tubs, saunas, pools, travel, caffeine use, fish and methlymercury, potential toxins, hair treatments, varicose veins Weight gain recommendations per IOM guidelines reviewed: underweight/BMI< 18.5--> gain 28 - 40 lbs; normal weight/BMI 18.5 - 24.9--> gain 25 - 35 lbs; overweight/BMI 25 - 29.9--> gain 15 - 25 lbs; obese/BMI >30->gain  11 - 20 lbs Problem list reviewed and updated. FIRST/CF mutation testing/NIPT/QUAD SCREEN/fragile X/Ashkenazi Jewish population testing/Spinal muscular atrophy discussed: ordered. Role of ultrasound in pregnancy discussed; fetal survey: ordered. Amniocentesis discussed: not indicated. VBAC calculator score:84% VBAC consent form provided Meds ordered this encounter  Medications  . Doxylamine-Pyridoxine (DICLEGIS) 10-10 MG TBEC    Sig: Take 1 tablet with breakfast and lunch.  Take 2 tablets at bedtime.    Dispense:  100 tablet    Refill:  4  . ondansetron (ZOFRAN) 8 MG tablet    Sig: Take 1 tablet (8 mg total) by mouth every 8 (eight) hours as needed for nausea or  vomiting.    Dispense:  40 tablet    Refill:  2  . Prenat-FeAsp-Meth-FA-DHA w/o A (PRENATE PIXIE) 10-0.6-0.4-200 MG CAPS    Sig: Take 1 tablet by mouth daily.    Dispense:  30 capsule    Refill:  12    Please process coupon: Rx BIN: [id number], RxPCN: [id number], RxGRP: [id number], RxID: [id number]  SUF: 01   Orders Placed This Encounter  Procedures  . Culture, OB Urine  . [place] MFM OB COMP + 14 WK    Standing Status:   Future    Standing Expiration Date:   04/10/2018    Order Specific Question:   Reason for Exam (SYMPTOM  OR DIAGNOSIS REQUIRED)    Answer:   fetal anatomy scan, late to care    Order Specific Question:   Preferred imaging location?    Answer:   [facility]Ul[facility]  . VITAMIN D 25 Hydroxy (Vit-D Deficiency, Fractures)  . Varicella zoster antibody, IgG  . Hemoglobinopathy evaluation  . MaterniT21 PLUS Core+SCA  Order Specific Question:   Is the patient insulin dependent?    Answer:   No    Order Specific Question:   Please enter gestational age. This should be expressed as weeks AND days, i.e. 16w 6d. Enter weeks here. Enter days in next question.    Answer:   77    Order Specific Question:   Please enter gestational age. This should be expressed as weeks AND days, i.e. 16w 6d. Enter days here. Enter weeks in previous question.    Answer:   3    Order Specific Question:   How was gestational age calculated?    Answer:   LMP    Order Specific Question:   Please give the date of LMP OR Ultrasound OR Estimated date of delivery.    Answer:   06/06/2017    Order Specific Question:   Number of Fetuses (Type of Pregnancy):    Answer:   1    Order Specific Question:   Indications for performing the test? (please choose all that apply):    Answer:   Routine screening    Order Specific Question:   Other Indications? (Y=Yes, N=No)    Answer:   N    Order Specific Question:   If this is a repeat specimen, please indicate the reason:    Answer:   Not indicated    Order Specific Question:    Please specify the patient's race: (C=White/Caucasion, B=Black, I=Native American, A=Asian, H=Hispanic, O=Other, U=Unknown)    Answer:   C    Order Specific Question:   Donor Egg - indicate if the egg was obtained from in vitro fertilization.    Answer:   N    Order Specific Question:   Age of Egg Donor.    Answer:   2    Order Specific Question:   Prior Down Syndrome/ONTD screening during current pregnancy.    Answer:   N    Order Specific Question:   Prior First Trimester Testing    Answer:   N    Order Specific Question:   Prior Second Trimester Testing    Answer:   N    Order Specific Question:   Family History of Neural Tube Defects    Answer:   N    Order Specific Question:   Prior Pregnancy with Down Syndrome    Answer:   N    Order Specific Question:   Please give the patient's weight (in pounds)    Answer:   211  . Cystic Fibrosis Mutation 97  . Obstetric Panel, Including HIV  . Hemoglobin A1c  . ToxASSURE Select 13 (MW), Urine  . Protein / creatinine ratio, urine  . POCT urinalysis dipstick    Follow up in 4 weeks. 50% of 30 min visit spent on counseling and coordination of care.

## 2017-02-11 ENCOUNTER — Ambulatory Visit (HOSPITAL_COMMUNITY)
Admission: RE | Admit: 2017-02-11 | Discharge: 2017-02-11 | Disposition: A | Discharge status: Home / Self Care | Payer: Medicaid Other | Source: Ambulatory Visit | Attending: Certified Nurse Midwife | Admitting: Certified Nurse Midwife

## 2017-02-11 DIAGNOSIS — Z363 Encounter for antenatal screening for malformations: Secondary | ICD-10-CM | POA: Insufficient documentation

## 2017-02-11 DIAGNOSIS — O09212 Supervision of pregnancy with history of pre-term labor, second trimester: Secondary | ICD-10-CM | POA: Insufficient documentation

## 2017-02-11 DIAGNOSIS — Z3687 Encounter for antenatal screening for uncertain dates: Secondary | ICD-10-CM | POA: Diagnosis not present

## 2017-02-11 DIAGNOSIS — Z3480 Encounter for supervision of other normal pregnancy, unspecified trimester: Secondary | ICD-10-CM

## 2017-02-11 DIAGNOSIS — O0930 Supervision of pregnancy with insufficient antenatal care, unspecified trimester: Secondary | ICD-10-CM

## 2017-02-11 DIAGNOSIS — O09292 Supervision of pregnancy with other poor reproductive or obstetric history, second trimester: Secondary | ICD-10-CM | POA: Insufficient documentation

## 2017-02-11 DIAGNOSIS — O09299 Supervision of pregnancy with other poor reproductive or obstetric history, unspecified trimester: Secondary | ICD-10-CM

## 2017-02-11 DIAGNOSIS — Z3A22 22 weeks gestation of pregnancy: Secondary | ICD-10-CM | POA: Insufficient documentation

## 2017-02-11 LAB — CERVICOVAGINAL ANCILLARY ONLY
BACTERIAL VAGINITIS: NEGATIVE
CHLAMYDIA, DNA PROBE: NEGATIVE
Candida vaginitis: NEGATIVE
Neisseria Gonorrhea: NEGATIVE
Trichomonas: NEGATIVE

## 2017-02-11 LAB — PROTEIN / CREATININE RATIO, URINE
Creatinine, Urine: 43 mg/dL
Protein, Ur: 5.5 mg/dL
Protein/Creat Ratio: 128 mg/g creat (ref 0–200)

## 2017-02-12 ENCOUNTER — Other Ambulatory Visit: Payer: Self-pay | Admitting: Certified Nurse Midwife

## 2017-02-12 DIAGNOSIS — Z3480 Encounter for supervision of other normal pregnancy, unspecified trimester: Secondary | ICD-10-CM

## 2017-02-12 LAB — CYTOLOGY - PAP: DIAGNOSIS: NEGATIVE

## 2017-02-12 LAB — CULTURE, OB URINE

## 2017-02-12 LAB — URINE CULTURE, OB REFLEX

## 2017-02-14 ENCOUNTER — Other Ambulatory Visit: Payer: Self-pay | Admitting: Certified Nurse Midwife

## 2017-02-14 DIAGNOSIS — Z3480 Encounter for supervision of other normal pregnancy, unspecified trimester: Secondary | ICD-10-CM

## 2017-02-14 DIAGNOSIS — R7989 Other specified abnormal findings of blood chemistry: Secondary | ICD-10-CM | POA: Insufficient documentation

## 2017-02-14 LAB — HEMOGLOBINOPATHY EVALUATION
HGB C: 0 %
HGB S: 0 %
HGB VARIANT: 0 %
Hemoglobin A2 Quantitation: 2.4 % (ref 1.8–3.2)
Hemoglobin F Quantitation: 0 % (ref 0.0–2.0)
Hgb A: 97.6 % (ref 96.4–98.8)

## 2017-02-14 LAB — OBSTETRIC PANEL, INCLUDING HIV
Antibody Screen: NEGATIVE
BASOS ABS: 0 10*3/uL (ref 0.0–0.2)
Basos: 0 %
EOS (ABSOLUTE): 0.1 10*3/uL (ref 0.0–0.4)
EOS: 1 %
HEMOGLOBIN: 12.7 g/dL (ref 11.1–15.9)
HIV Screen 4th Generation wRfx: NONREACTIVE
Hematocrit: 37.6 % (ref 34.0–46.6)
Hepatitis B Surface Ag: NEGATIVE
IMMATURE GRANS (ABS): 0 10*3/uL (ref 0.0–0.1)
IMMATURE GRANULOCYTES: 0 %
LYMPHS ABS: 2.4 10*3/uL (ref 0.7–3.1)
LYMPHS: 19 %
MCH: 30.9 pg (ref 26.6–33.0)
MCHC: 33.8 g/dL (ref 31.5–35.7)
MCV: 92 fL (ref 79–97)
MONOCYTES: 6 %
Monocytes Absolute: 0.7 10*3/uL (ref 0.1–0.9)
Neutrophils Absolute: 9.1 10*3/uL — ABNORMAL HIGH (ref 1.4–7.0)
Neutrophils: 74 %
PLATELETS: 170 10*3/uL (ref 150–379)
RBC: 4.11 x10E6/uL (ref 3.77–5.28)
RDW: 13.6 % (ref 12.3–15.4)
RH TYPE: POSITIVE
RPR Ser Ql: NONREACTIVE
RUBELLA: 1.35 {index} (ref >0.99)
WBC: 12.3 10*3/uL — AB (ref 3.4–10.8)

## 2017-02-14 LAB — CYSTIC FIBROSIS MUTATION 97: GENE DIS ANAL CARRIER INTERP BLD/T-IMP: NOT DETECTED

## 2017-02-14 LAB — HEMOGLOBIN A1C
ESTIMATED AVERAGE GLUCOSE: 88 mg/dL
Hgb A1c MFr Bld: 4.7 % — ABNORMAL LOW (ref 4.8–5.6)

## 2017-02-14 LAB — VARICELLA ZOSTER ANTIBODY, IGG: VARICELLA: 1089 {index} (ref >165)

## 2017-02-14 LAB — VITAMIN D 25 HYDROXY (VIT D DEFICIENCY, FRACTURES): VIT D 25 HYDROXY: 11.9 ng/mL — AB (ref 30.0–100.0)

## 2017-02-14 MED ORDER — VITAMIN D (ERGOCALCIFEROL) 1.25 MG (50000 UNIT) PO CAPS: 50000.0000 [IU] | ORAL_CAPSULE | ORAL | 2 refills | Status: DC

## 2017-02-15 LAB — TOXASSURE SELECT 13 (MW), URINE

## 2017-02-17 ENCOUNTER — Other Ambulatory Visit: Payer: Self-pay | Admitting: Certified Nurse Midwife

## 2017-02-17 DIAGNOSIS — F1210 Cannabis abuse, uncomplicated: Secondary | ICD-10-CM | POA: Insufficient documentation

## 2017-02-18 LAB — MATERNIT21 PLUS CORE+SCA
CHROMOSOME 13: NEGATIVE
CHROMOSOME 21: NEGATIVE
Chromosome 18: NEGATIVE
Y Chromosome: NOT DETECTED

## 2017-02-19 ENCOUNTER — Other Ambulatory Visit: Payer: Self-pay | Admitting: Certified Nurse Midwife

## 2017-02-19 DIAGNOSIS — Z3480 Encounter for supervision of other normal pregnancy, unspecified trimester: Secondary | ICD-10-CM

## 2017-02-24 ENCOUNTER — Telehealth: Payer: Self-pay

## 2017-02-24 ENCOUNTER — Encounter: Payer: Self-pay | Admitting: Certified Nurse Midwife

## 2017-02-24 NOTE — Telephone Encounter (Signed)
Contacted and advised of results and rx sent.

## 2017-02-25 ENCOUNTER — Encounter: Payer: Medicaid Other | Admitting: Obstetrics

## 2017-03-12 ENCOUNTER — Ambulatory Visit (HOSPITAL_COMMUNITY)
Admission: RE | Admit: 2017-03-12 | Discharge: 2017-03-12 | Disposition: A | Discharge status: Home / Self Care | Payer: Medicaid Other | Source: Ambulatory Visit | Attending: Certified Nurse Midwife | Admitting: Certified Nurse Midwife

## 2017-03-12 DIAGNOSIS — O34211 Maternal care for low transverse scar from previous cesarean delivery: Secondary | ICD-10-CM | POA: Diagnosis not present

## 2017-03-12 DIAGNOSIS — O09292 Supervision of pregnancy with other poor reproductive or obstetric history, second trimester: Secondary | ICD-10-CM | POA: Diagnosis not present

## 2017-03-12 DIAGNOSIS — O09212 Supervision of pregnancy with history of pre-term labor, second trimester: Secondary | ICD-10-CM | POA: Diagnosis present

## 2017-03-12 DIAGNOSIS — Z362 Encounter for other antenatal screening follow-up: Secondary | ICD-10-CM | POA: Insufficient documentation

## 2017-03-12 DIAGNOSIS — Z3A27 27 weeks gestation of pregnancy: Secondary | ICD-10-CM | POA: Insufficient documentation

## 2017-03-12 DIAGNOSIS — Z3480 Encounter for supervision of other normal pregnancy, unspecified trimester: Secondary | ICD-10-CM

## 2017-03-13 ENCOUNTER — Other Ambulatory Visit: Payer: Self-pay | Admitting: Certified Nurse Midwife

## 2017-03-13 DIAGNOSIS — Z3480 Encounter for supervision of other normal pregnancy, unspecified trimester: Secondary | ICD-10-CM

## 2017-03-15 ENCOUNTER — Telehealth: Payer: Self-pay | Admitting: Obstetrics & Gynecology

## 2017-03-15 ENCOUNTER — Observation Stay (HOSPITAL_COMMUNITY): Payer: Medicaid Other

## 2017-03-15 ENCOUNTER — Observation Stay (HOSPITAL_COMMUNITY)
Admission: AD | Admit: 2017-03-15 | Discharge: 2017-03-16 | Disposition: A | Discharge status: Home / Self Care | Payer: Medicaid Other | Source: Ambulatory Visit | Attending: Obstetrics & Gynecology | Admitting: Obstetrics & Gynecology

## 2017-03-15 DIAGNOSIS — Y939 Activity, unspecified: Secondary | ICD-10-CM | POA: Insufficient documentation

## 2017-03-15 DIAGNOSIS — O9A213 Injury, poisoning and certain other consequences of external causes complicating pregnancy, third trimester: Secondary | ICD-10-CM | POA: Diagnosis present

## 2017-03-15 DIAGNOSIS — O133 Gestational [pregnancy-induced] hypertension without significant proteinuria, third trimester: Secondary | ICD-10-CM | POA: Diagnosis not present

## 2017-03-15 DIAGNOSIS — Y999 Unspecified external cause status: Secondary | ICD-10-CM | POA: Diagnosis not present

## 2017-03-15 DIAGNOSIS — Y92410 Unspecified street and highway as the place of occurrence of the external cause: Secondary | ICD-10-CM | POA: Insufficient documentation

## 2017-03-15 DIAGNOSIS — Z3A28 28 weeks gestation of pregnancy: Secondary | ICD-10-CM | POA: Diagnosis not present

## 2017-03-15 DIAGNOSIS — T1490XD Injury, unspecified, subsequent encounter: Secondary | ICD-10-CM

## 2017-03-15 LAB — CBC
HCT: 36.1 % (ref 36.0–46.0)
Hemoglobin: 12.4 g/dL (ref 12.0–15.0)
MCH: 31.2 pg (ref 26.0–34.0)
MCHC: 34.3 g/dL (ref 30.0–36.0)
MCV: 90.7 fL (ref 78.0–100.0)
PLATELETS: 135 10*3/uL — AB (ref 150–400)
RBC: 3.98 MIL/uL (ref 3.87–5.11)
RDW: 12.5 % (ref 11.5–15.5)
WBC: 9.5 10*3/uL (ref 4.0–10.5)

## 2017-03-15 LAB — TYPE AND SCREEN
ABO/RH(D): O POS
Antibody Screen: NEGATIVE

## 2017-03-15 LAB — KLEIHAUER-BETKE STAIN
# Vials RhIg: 1
Fetal Cells %: 0 %
Quantitation Fetal Hemoglobin: 0 mL

## 2017-03-15 LAB — PROTIME-INR
INR: 1
Prothrombin Time: 13.2 seconds (ref 11.4–15.2)

## 2017-03-15 LAB — FIBRINOGEN: FIBRINOGEN: 455 mg/dL (ref 210–475)

## 2017-03-15 MED ORDER — DOCUSATE SODIUM 100 MG PO CAPS
100.0000 mg | ORAL_CAPSULE | Freq: Every day | ORAL | Status: DC
  Administered 2017-03-15: 100 mg via ORAL
  Filled 2017-03-15: qty 1

## 2017-03-15 MED ORDER — ZOLPIDEM TARTRATE 5 MG PO TABS: 5.0000 mg | ORAL_TABLET | Freq: Every evening | ORAL | Status: DC | PRN

## 2017-03-15 MED ORDER — VITAMIN D (ERGOCALCIFEROL) 1.25 MG (50000 UNIT) PO CAPS: 50000.0000 [IU] | ORAL_CAPSULE | ORAL | Status: DC

## 2017-03-15 MED ORDER — PRENATAL MULTIVITAMIN CH
1.0000 | ORAL_TABLET | Freq: Every day | ORAL | Status: DC
Start: 2017-03-16 — End: 2017-03-16

## 2017-03-15 MED ORDER — CALCIUM CARBONATE ANTACID 500 MG PO CHEW: 2.0000 | CHEWABLE_TABLET | ORAL | Status: DC | PRN

## 2017-03-15 MED ORDER — CYCLOBENZAPRINE HCL 10 MG PO TABS
10.0000 mg | ORAL_TABLET | Freq: Three times a day (TID) | ORAL | Status: DC | PRN
  Administered 2017-03-16: 10 mg via ORAL
  Filled 2017-03-15 (×2): qty 1

## 2017-03-15 MED ORDER — ACETAMINOPHEN 325 MG PO TABS: 650.0000 mg | ORAL_TABLET | ORAL | Status: DC | PRN

## 2017-03-15 MED ORDER — DIPHENHYDRAMINE HCL 25 MG PO CAPS: 25.0000 mg | ORAL_CAPSULE | Freq: Four times a day (QID) | ORAL | Status: DC | PRN

## 2017-03-15 MED ORDER — OXYCODONE-ACETAMINOPHEN 5-325 MG PO TABS
1.0000 | ORAL_TABLET | Freq: Four times a day (QID) | ORAL | Status: DC | PRN
  Administered 2017-03-15 – 2017-03-16 (×2): 2 via ORAL
  Filled 2017-03-15 (×2): qty 2
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
## 2017-03-16 DIAGNOSIS — O9A213 Injury, poisoning and certain other consequences of external causes complicating pregnancy, third trimester: Secondary | ICD-10-CM | POA: Diagnosis not present

## 2017-03-16 MED ORDER — DOUBLE ANTIBIOTIC 500-10000 UNIT/GM EX OINT: TOPICAL_OINTMENT | Freq: Two times a day (BID) | CUTANEOUS | Status: DC

## 2017-03-16 MED ORDER — CYCLOBENZAPRINE HCL 10 MG PO TABS: 10.0000 mg | ORAL_TABLET | Freq: Three times a day (TID) | ORAL | 2 refills | Status: DC | PRN

## 2017-03-16 MED ORDER — BACITRACIN-NEOMYCIN-POLYMYXIN OINTMENT TUBE
TOPICAL_OINTMENT | CUTANEOUS | Status: DC | PRN
  Filled 2017-03-16: qty 14.17

## 2017-03-16 MED ORDER — OXYCODONE-ACETAMINOPHEN 5-325 MG PO TABS: 1.0000 | ORAL_TABLET | Freq: Four times a day (QID) | ORAL | 0 refills | Status: DC | PRN
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
## 2017-03-16 NOTE — Progress Notes (Signed)
Pt given discharge instructions and prescription, questions answered, states understanding, signed and given copy

## 2017-03-17 ENCOUNTER — Ambulatory Visit (INDEPENDENT_AMBULATORY_CARE_PROVIDER_SITE_OTHER): Payer: Medicaid Other | Admitting: Obstetrics & Gynecology

## 2017-03-17 VITALS — BP 114/74 | HR 80 | Wt 212.0 lb

## 2017-03-17 DIAGNOSIS — O09299 Supervision of pregnancy with other poor reproductive or obstetric history, unspecified trimester: Secondary | ICD-10-CM

## 2017-03-17 DIAGNOSIS — O09293 Supervision of pregnancy with other poor reproductive or obstetric history, third trimester: Secondary | ICD-10-CM | POA: Diagnosis not present

## 2017-03-17 DIAGNOSIS — Z3480 Encounter for supervision of other normal pregnancy, unspecified trimester: Secondary | ICD-10-CM
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
## 2017-03-18 LAB — GLUCOSE TOLERANCE, 2 HOURS W/ 1HR
GLUCOSE, 1 HOUR: 135 mg/dL (ref 65–179)
GLUCOSE, 2 HOUR: 75 mg/dL (ref 65–152)
Glucose, Fasting: 74 mg/dL (ref 65–91)

## 2017-03-18 LAB — CBC
Hematocrit: 38.6 % (ref 34.0–46.6)
Hemoglobin: 13.1 g/dL (ref 11.1–15.9)
MCH: 30.8 pg (ref 26.6–33.0)
MCHC: 33.9 g/dL (ref 31.5–35.7)
MCV: 91 fL (ref 79–97)
PLATELETS: 154 10*3/uL (ref 150–379)
RBC: 4.26 x10E6/uL (ref 3.77–5.28)
RDW: 13.2 % (ref 12.3–15.4)
WBC: 8.9 10*3/uL (ref 3.4–10.8)

## 2017-03-18 LAB — RPR: RPR: NONREACTIVE

## 2017-03-18 LAB — HIV ANTIBODY (ROUTINE TESTING W REFLEX): HIV SCREEN 4TH GENERATION: NONREACTIVE

## 2017-04-07 ENCOUNTER — Ambulatory Visit (INDEPENDENT_AMBULATORY_CARE_PROVIDER_SITE_OTHER): Payer: Medicaid Other | Admitting: Certified Nurse Midwife

## 2017-04-07 ENCOUNTER — Encounter: Payer: Self-pay | Admitting: Certified Nurse Midwife

## 2017-04-07 VITALS — BP 122/77 | HR 105 | Wt 221.0 lb

## 2017-04-07 DIAGNOSIS — O34219 Maternal care for unspecified type scar from previous cesarean delivery: Secondary | ICD-10-CM | POA: Diagnosis not present

## 2017-04-07 DIAGNOSIS — E559 Vitamin D deficiency, unspecified: Secondary | ICD-10-CM | POA: Diagnosis not present

## 2017-04-07 DIAGNOSIS — F1210 Cannabis abuse, uncomplicated: Secondary | ICD-10-CM | POA: Diagnosis not present

## 2017-04-07 DIAGNOSIS — Z3480 Encounter for supervision of other normal pregnancy, unspecified trimester: Secondary | ICD-10-CM

## 2017-04-07 DIAGNOSIS — O09293 Supervision of pregnancy with other poor reproductive or obstetric history, third trimester: Secondary | ICD-10-CM

## 2017-04-07 DIAGNOSIS — O09299 Supervision of pregnancy with other poor reproductive or obstetric history, unspecified trimester: Secondary | ICD-10-CM

## 2017-04-07 DIAGNOSIS — R7989 Other specified abnormal findings of blood chemistry: Secondary | ICD-10-CM

## 2017-04-07 DIAGNOSIS — O9A213 Injury, poisoning and certain other consequences of external causes complicating pregnancy, third trimester: Secondary | ICD-10-CM

## 2017-04-07 DIAGNOSIS — Z98891 History of uterine scar from previous surgery: Secondary | ICD-10-CM

## 2017-04-07 MED ORDER — CYCLOBENZAPRINE HCL 10 MG PO TABS: 10.0000 mg | ORAL_TABLET | Freq: Three times a day (TID) | ORAL | 2 refills | Status: DC | PRN
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
## 2017-04-17 ENCOUNTER — Inpatient Hospital Stay (HOSPITAL_COMMUNITY)
Admission: AD | Admit: 2017-04-17 | Discharge: 2017-04-17 | Disposition: A | Discharge status: Home / Self Care | Payer: Medicaid Other | Source: Ambulatory Visit | Attending: Obstetrics and Gynecology | Admitting: Obstetrics and Gynecology

## 2017-04-17 ENCOUNTER — Encounter (HOSPITAL_COMMUNITY): Payer: Self-pay | Admitting: *Deleted

## 2017-04-17 DIAGNOSIS — Z3A32 32 weeks gestation of pregnancy: Secondary | ICD-10-CM | POA: Insufficient documentation

## 2017-04-17 DIAGNOSIS — O1493 Unspecified pre-eclampsia, third trimester: Secondary | ICD-10-CM | POA: Diagnosis not present

## 2017-04-17 DIAGNOSIS — O9989 Other specified diseases and conditions complicating pregnancy, childbirth and the puerperium: Secondary | ICD-10-CM | POA: Insufficient documentation

## 2017-04-17 DIAGNOSIS — K529 Noninfective gastroenteritis and colitis, unspecified: Secondary | ICD-10-CM | POA: Diagnosis not present

## 2017-04-17 DIAGNOSIS — R197 Diarrhea, unspecified: Secondary | ICD-10-CM | POA: Diagnosis present

## 2017-04-17 DIAGNOSIS — R112 Nausea with vomiting, unspecified: Secondary | ICD-10-CM | POA: Insufficient documentation

## 2017-04-17 DIAGNOSIS — O09213 Supervision of pregnancy with history of pre-term labor, third trimester: Secondary | ICD-10-CM | POA: Insufficient documentation

## 2017-04-17 LAB — URINALYSIS, ROUTINE W REFLEX MICROSCOPIC
BILIRUBIN URINE: NEGATIVE
Glucose, UA: NEGATIVE mg/dL
HGB URINE DIPSTICK: NEGATIVE
KETONES UR: 20 mg/dL — AB
NITRITE: NEGATIVE
PROTEIN: NEGATIVE mg/dL
Specific Gravity, Urine: 1.016 (ref 1.005–1.030)
pH: 7 (ref 5.0–8.0)

## 2017-04-17 LAB — FETAL FIBRONECTIN: FETAL FIBRONECTIN: NEGATIVE

## 2017-04-17 MED ORDER — PROMETHAZINE HCL 25 MG/ML IJ SOLN
25.0000 mg | Freq: Four times a day (QID) | INTRAMUSCULAR | Status: DC | PRN
  Administered 2017-04-17: 25 mg via INTRAVENOUS
  Filled 2017-04-17: qty 1

## 2017-04-17 MED ORDER — PROMETHAZINE HCL 25 MG PO TABS: 12.5000 mg | ORAL_TABLET | Freq: Four times a day (QID) | ORAL | 0 refills | Status: DC | PRN

## 2017-04-17 MED ORDER — LACTATED RINGERS IV BOLUS (SEPSIS)
1000.0000 mL | Freq: Once | INTRAVENOUS | Status: AC
  Administered 2017-04-17: 1000 mL via INTRAVENOUS
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
## 2017-04-17 NOTE — Discharge Instructions (Signed)
Bland Diet °A bland diet consists of foods that do not have a lot of fat or fiber. Foods without fat or fiber are easier for the body to digest. They are also less likely to irritate your mouth, throat, stomach, and other parts of your gastrointestinal tract. A bland diet is sometimes called a BRAT diet. °What is my plan? °Your health care provider or dietitian may recommend specific changes to your diet to prevent and treat your symptoms, such as: °· Eating small meals often. °· Cooking food until it is soft enough to chew easily. °· Chewing your food well. °· Drinking fluids slowly. °· Not eating foods that are very spicy, sour, or fatty. °· Not eating citrus fruits, such as oranges and grapefruit. °What do I need to know about this diet? °· Eat a variety of foods from the bland diet food list. °· Do not follow a bland diet longer than you have to. °· Ask your health care provider whether you should take vitamins. °What foods can I eat? °Grains  ° °Hot cereals, such as cream of wheat. Bread, crackers, or tortillas made from refined white flour. Rice. °Vegetables  °Canned or cooked vegetables. Mashed or boiled potatoes. °Fruits  °Bananas. Applesauce. Other types of cooked or canned fruit with the skin and seeds removed, such as canned peaches or pears. °Meats and Other Protein Sources  °Scrambled eggs. Creamy peanut butter or other nut butters. Lean, well-cooked meats, such as chicken or fish. Tofu. Soups or broths. °Dairy  °Low-fat dairy products, such as milk, cottage cheese, or yogurt. °Beverages  °Water. Herbal tea. Apple juice. °Sweets and Desserts  °Pudding. Custard. Fruit gelatin. Ice cream. °Fats and Oils  °Mild salad dressings. Canola or olive oil. °The items listed above may not be a complete list of allowed foods or beverages. Contact your dietitian for more options.  °What foods are not recommended? °Foods and ingredients that are often not recommended include: °· Spicy foods, such as hot sauce or  salsa. °· Fried foods. °· Sour foods, such as pickled or fermented foods. °· Raw vegetables or fruits, especially citrus or berries. °· Caffeinated drinks. °· Alcohol. °· Strongly flavored seasonings or condiments. °The items listed above may not be a complete list of foods and beverages that are not allowed. Contact your dietitian for more information.  °This information is not intended to replace advice given to you by your health care provider. Make sure you discuss any questions you have with your health care provider. °Document Released: 03/25/2016 Document Revised: 05/09/2016 Document Reviewed: 12/14/2014 °Elsevier Interactive Patient Education © 2017 [employer] °Viral Gastroenteritis, Adult °Viral gastroenteritis is also known as the stomach flu. This condition is caused by various viruses. These viruses can be passed from person to person very easily (are very contagious). This condition may affect your stomach, small intestine, and large intestine. It can cause sudden watery diarrhea, fever, and vomiting. °Diarrhea and vomiting can make you feel weak and cause you to become dehydrated. You may not be able to keep fluids down. Dehydration can make you tired and thirsty, cause you to have a dry mouth, and decrease how often you urinate. Older adults and people with other diseases or a weak immune system are at higher risk for dehydration. °It is important to replace the fluids that you lose from diarrhea and vomiting. If you become severely dehydrated, you may need to get fluids through an IV tube. °What are the causes? °Gastroenteritis is caused by various viruses, including   rotavirus and norovirus. Norovirus is the most common cause in adults. °You can get sick by eating food, drinking water, or touching a surface contaminated with one of these viruses. You can also get sick from sharing utensils or other personal items with an infected person. °What increases the risk? °This condition is more likely to  develop in people: °· Who have a weak defense system (immune system). °· Who live with one or more children who are younger than 2 years old. °· Who live in a nursing home. °· Who go on cruise ships. °What are the signs or symptoms? °Symptoms of this condition start suddenly 1-2 days after exposure to a virus. Symptoms may last a few days or as long as a week. The most common symptoms are watery diarrhea and vomiting. Other symptoms include: °· Fever. °· Headache. °· Fatigue. °· Pain in the abdomen. °· Chills. °· Weakness. °· Nausea. °· Muscle aches. °· Loss of appetite. °How is this diagnosed? °This condition is diagnosed with a medical history and physical exam. You may also have a stool test to check for viruses or other infections. °How is this treated? °This condition typically goes away on its own. The focus of treatment is to restore lost fluids (rehydration). Your health care provider may recommend that you take an oral rehydration solution (ORS) to replace important salts and minerals (electrolytes) in your body. Severe cases of this condition may require giving fluids through an IV tube. °Treatment may also include medicine to help with your symptoms. °Follow these instructions at home: °Follow instructions from your health care provider about how to care for yourself at home. °Eating and drinking  °Follow these recommendations as told by your health care provider: °· Take an ORS. This is a drink that is sold at pharmacies and retail stores. °· Drink clear fluids in small amounts as you are able. Clear fluids include water, ice chips, diluted fruit juice, and low-calorie sports drinks. °· Eat bland, easy-to-digest foods in small amounts as you are able. These foods include bananas, applesauce, rice, lean meats, toast, and crackers. °· Avoid fluids that contain a lot of sugar or caffeine, such as energy drinks, sports drinks, and soda. °· Avoid alcohol. °· Avoid spicy or fatty foods. °General instructions   ° °· Drink enough fluid to keep your urine clear or pale yellow. °· Wash your hands often. If soap and water are not available, use hand sanitizer. °· Make sure that all people in your household wash their hands well and often. °· Take over-the-counter and prescription medicines only as told by your health care provider. °· Rest at home while you recover. °· Watch your condition for any changes. °· Take a warm bath to relieve any burning or pain from frequent diarrhea episodes. °· Keep all follow-up visits as told by your health care provider. This is important. °Contact a health care provider if: °· You cannot keep fluids down. °· Your symptoms get worse. °· You have new symptoms. °· You feel light-headed or dizzy. °· You have muscle cramps. °Get help right away if: °· You have chest pain. °· You feel extremely weak or you faint. °· You see blood in your vomit. °· Your vomit looks like coffee grounds. °· You have bloody or black stools or stools that look like tar. °· You have a severe headache, a stiff neck, or both. °· You have a rash. °· You have severe pain, cramping, or bloating in your abdomen. °· You have   trouble breathing or you are breathing very quickly. °· Your heart is beating very quickly. °· Your skin feels cold and clammy. °· You feel confused. °· You have pain when you urinate. °· You have signs of dehydration, such as: °¨ Dark urine, very little urine, or no urine. °¨ Cracked lips. °¨ Dry mouth. °¨ Sunken eyes. °¨ Sleepiness. °¨ Weakness. °This information is not intended to replace advice given to you by your health care provider. Make sure you discuss any questions you have with your health care provider. °Document Released: 12/02/2005 Document Revised: 05/15/2016 Document Reviewed: 08/08/2015 °Elsevier Interactive Patient Education © 2017 [employer] ° °

## 2017-04-17 NOTE — MAU Provider Note (Signed)
History     CSN: [id number]  Arrival date and time: 04/17/17 1010   First Provider Initiated Contact with Patient 04/17/17 1115      Chief Complaint  Patient presents with  . Body Fluid Exposure  . Diarrhea  . Contractions   HPI Lori Mcmahon is a 26 y.o. G3P1102 at [redacted]w[redacted]d who presents with nausea, vomiting and diarrhea for 2 days. States she has not been able to keep anything down, even water and states she has vomited 10 times in the last 24 hours. She says she has been having contractions for the last "couple of weeks" that have gotten worse and more painful over the last 2 days. States it is 1-2 contractions every hour. Denies leaking of fluid or vaginal bleeding. Reports good fetal movement.    History of a preterm delivery at 33 weeks for preeclampsia with her last pregnancy.  OB History    Gravida Para Term Preterm AB Living   3 2 1 1   2    SAB TAB Ectopic Multiple Live Births         0 2      Past Medical History:  Diagnosis Date  . Low blood potassium   . Pregnancy induced hypertension     Past Surgical History:  Procedure Laterality Date  . CESAREAN SECTION N/A 03/02/2016   Procedure: CESAREAN SECTION;  Surgeon: Waynard Reeds, MD;  Location: [facility];  Service: Obstetrics;  Laterality: N/A;  . KNEE SURGERY    . TONSILLECTOMY      Family History  Problem Relation Age of Onset  . Other Mother     Preeclampsia in labor    Social History  Substance Use Topics  . Smoking status: Never Smoker  . Smokeless tobacco: Never Used  . Alcohol use No     Comment: stopped 1 1/2 months ago, would smoke 1-2 times a day    Allergies:  Allergies  Allergen Reactions  . Ultram [Tramadol] Hives    Unknown reaction was rushed to ER when she took tramadol    Prescriptions Prior to Admission  Medication Sig Dispense Refill Last Dose  . acetaminophen (TYLENOL) 325 MG tablet Take 650 mg by mouth every 4 (four) hours as needed for headache. Reported on 01/04/2016   04/16/2017  at Unknown time  . Doxylamine-Pyridoxine (DICLEGIS) 10-10 MG TBEC Take 1 tablet with breakfast and lunch.  Take 2 tablets at bedtime. 100 tablet 4 Past Week at Unknown time  . Prenat-FeAsp-Meth-FA-DHA w/o A (PRENATE PIXIE) 10-0.6-0.4-200 MG CAPS Take 1 tablet by mouth daily. 30 capsule 12 Past Week at Unknown time  . cyclobenzaprine (FLEXERIL) 10 MG tablet Take 1 tablet (10 mg total) by mouth 3 (three) times daily as needed for muscle spasms. 30 tablet 2   . oxyCODONE-acetaminophen (PERCOCET/ROXICET) 5-325 MG tablet Take 1-2 tablets by mouth every 6 (six) hours as needed for moderate pain or severe pain. 30 tablet 0     Review of Systems  Constitutional: Negative.   Respiratory: Negative.   Cardiovascular: Negative.   Gastrointestinal: Positive for abdominal pain, diarrhea, nausea and vomiting.  Genitourinary: Negative for vaginal bleeding and vaginal discharge.  Neurological: Negative for dizziness.  Psychiatric/Behavioral: Negative.    Physical Exam   Blood pressure 118/72, pulse 92, temperature 97.6 F (36.4 C), temperature source Oral, resp. rate 18, height 5\' 11"  (1.803 m), weight 215 lb (97.5 kg), last menstrual period 08/30/2016, unknown if currently breastfeeding.  Physical Exam  Nursing note and vitals reviewed. Constitutional:  She appears well-developed and well-nourished.  HENT:  Head: Normocephalic and atraumatic.  Eyes: Conjunctivae are normal. No scleral icterus.  Respiratory: Effort normal. No respiratory distress.  GI: Soft. There is no tenderness.  Neurological: She is alert.  Skin: Skin is warm and dry.  Psychiatric: She has a normal mood and affect. Her behavior is normal. Judgment and thought content normal.   Bimanual exam: Cervix 0/long/high, soft  Fetal Tracing:  Baseline: 135 Variability: moderate Accelerations: 15x15 Decelerations: none  Toco: 2uc's lasting 40-50 seconds   MAU Course  Procedures Results for orders placed or performed during the  hospital encounter of 04/17/17 (from the past 24 hour(s))  Urinalysis, Routine w reflex microscopic     Status: Abnormal   Collection Time: 04/17/17 10:29 AM  Result Value Ref Range   Color, Urine YELLOW YELLOW   APPearance CLOUDY (A) CLEAR   Specific Gravity, Urine 1.016 1.005 - 1.030   pH 7.0 5.0 - 8.0   Glucose, UA NEGATIVE NEGATIVE mg/dL   Hgb urine dipstick NEGATIVE NEGATIVE   Bilirubin Urine NEGATIVE NEGATIVE   Ketones, ur 20 (A) NEGATIVE mg/dL   Protein, ur NEGATIVE NEGATIVE mg/dL   Nitrite NEGATIVE NEGATIVE   Leukocytes, UA LARGE (A) NEGATIVE   RBC / HPF 0-5 0 - 5 RBC/hpf   WBC, UA 6-30 0 - 5 WBC/hpf   Bacteria, UA RARE (A) NONE SEEN   Squamous Epithelial / LPF TOO NUMEROUS TO COUNT (A) NONE SEEN   Mucous PRESENT   Fetal fibronectin     Status: None   Collection Time: 04/17/17 11:23 AM  Result Value Ref Range   Fetal Fibronectin NEGATIVE NEGATIVE    MDM FFN, cervical exam IV fluid bolus and phenergan PO challenge  Assessment and Plan   1. Gastroenteritis     Reviewed gastroenteritis care and BRAT diet. Preterm labor signs and symptoms reviewed. Preeclampsia symptoms reviewed. Phenergan prescribed for home. Return to MAU if symptoms persist.    Cleone SlimCaroline Neill SNM 04/17/2017, 12:37 PM   I confirm that I have verified the information documented in the nurse-midwife student's note and that I have also personally reperformed the physical exam and all medical decision making activities.The pt cramping is likely r/t mild dehydration and gastroenteritis. Pt was evaluated for preterm labor and has history of delivery at 33 weeks but FFN negative and cervix closed so no evidence of PTL today.  Sharen CounterLisa Leftwich-Kirby, CNM 8:20 PM

## 2017-04-17 NOTE — MAU Note (Signed)
Pt has been having N/V/D x 2 days. Can't keep anything down. Stated she had been having increased contractions that are more painful only about 1-2 hour. Also c/o headache afraid it may be her blood presser she had preeclampsia with her last pregnancy.

## 2017-04-21 ENCOUNTER — Ambulatory Visit (INDEPENDENT_AMBULATORY_CARE_PROVIDER_SITE_OTHER): Payer: Medicaid Other | Admitting: Certified Nurse Midwife

## 2017-04-21 ENCOUNTER — Encounter: Payer: Self-pay | Admitting: Certified Nurse Midwife

## 2017-04-21 VITALS — BP 116/75 | HR 93 | Wt 220.9 lb

## 2017-04-21 DIAGNOSIS — E559 Vitamin D deficiency, unspecified: Secondary | ICD-10-CM

## 2017-04-21 DIAGNOSIS — Z3480 Encounter for supervision of other normal pregnancy, unspecified trimester: Secondary | ICD-10-CM

## 2017-04-21 DIAGNOSIS — O219 Vomiting of pregnancy, unspecified: Secondary | ICD-10-CM | POA: Diagnosis not present

## 2017-04-21 DIAGNOSIS — O09293 Supervision of pregnancy with other poor reproductive or obstetric history, third trimester: Secondary | ICD-10-CM | POA: Diagnosis not present

## 2017-04-21 DIAGNOSIS — R7989 Other specified abnormal findings of blood chemistry: Secondary | ICD-10-CM

## 2017-04-21 DIAGNOSIS — Z98891 History of uterine scar from previous surgery: Secondary | ICD-10-CM

## 2017-04-21 DIAGNOSIS — O09299 Supervision of pregnancy with other poor reproductive or obstetric history, unspecified trimester: Secondary | ICD-10-CM

## 2017-04-21 MED ORDER — DOXYLAMINE-PYRIDOXINE 10-10 MG PO TBEC
DELAYED_RELEASE_TABLET | ORAL | 4 refills | Status: DC
Start: 2017-04-21 — End: 2017-05-31

## 2017-04-21 NOTE — Progress Notes (Signed)
Patient reports good fetal movement, and states that she has started to get her appetite back and has not been so nauseous. Pt also states that she feels contractions that come and go.
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
## 2017-04-22 ENCOUNTER — Other Ambulatory Visit: Payer: Self-pay | Admitting: Certified Nurse Midwife

## 2017-04-22 DIAGNOSIS — Z3480 Encounter for supervision of other normal pregnancy, unspecified trimester: Secondary | ICD-10-CM

## 2017-04-23 ENCOUNTER — Other Ambulatory Visit: Payer: Self-pay | Admitting: Certified Nurse Midwife

## 2017-05-05 ENCOUNTER — Encounter: Payer: Medicaid Other | Admitting: Certified Nurse Midwife

## 2017-05-15 ENCOUNTER — Other Ambulatory Visit (HOSPITAL_COMMUNITY)
Admission: RE | Admit: 2017-05-15 | Discharge: 2017-05-15 | Disposition: A | Discharge status: Home / Self Care | Payer: Medicaid Other | Source: Ambulatory Visit | Attending: Certified Nurse Midwife | Admitting: Certified Nurse Midwife

## 2017-05-15 ENCOUNTER — Ambulatory Visit (INDEPENDENT_AMBULATORY_CARE_PROVIDER_SITE_OTHER): Payer: Medicaid Other | Admitting: Certified Nurse Midwife

## 2017-05-15 VITALS — BP 119/79 | HR 83 | Wt 220.0 lb

## 2017-05-15 DIAGNOSIS — O34219 Maternal care for unspecified type scar from previous cesarean delivery: Secondary | ICD-10-CM | POA: Diagnosis not present

## 2017-05-15 DIAGNOSIS — Z3480 Encounter for supervision of other normal pregnancy, unspecified trimester: Secondary | ICD-10-CM | POA: Diagnosis not present

## 2017-05-15 DIAGNOSIS — O09293 Supervision of pregnancy with other poor reproductive or obstetric history, third trimester: Secondary | ICD-10-CM | POA: Diagnosis not present

## 2017-05-15 DIAGNOSIS — Z98891 History of uterine scar from previous surgery: Secondary | ICD-10-CM

## 2017-05-15 DIAGNOSIS — O09299 Supervision of pregnancy with other poor reproductive or obstetric history, unspecified trimester: Secondary | ICD-10-CM
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
## 2017-05-15 NOTE — Progress Notes (Signed)
Pt states that she has had increase in ctx, nothing regular.

## 2017-05-16 LAB — CERVICOVAGINAL ANCILLARY ONLY
BACTERIAL VAGINITIS: POSITIVE — AB
CANDIDA VAGINITIS: NEGATIVE
Chlamydia: NEGATIVE
Neisseria Gonorrhea: NEGATIVE
Trichomonas: NEGATIVE

## 2017-05-17 LAB — STREP GP B NAA: Strep Gp B NAA: NEGATIVE

## 2017-05-19 ENCOUNTER — Other Ambulatory Visit: Payer: Self-pay | Admitting: Certified Nurse Midwife

## 2017-05-19 DIAGNOSIS — B9689 Other specified bacterial agents as the cause of diseases classified elsewhere: Secondary | ICD-10-CM

## 2017-05-19 DIAGNOSIS — Z3480 Encounter for supervision of other normal pregnancy, unspecified trimester: Secondary | ICD-10-CM

## 2017-05-19 DIAGNOSIS — N760 Acute vaginitis: Principal | ICD-10-CM

## 2017-05-19 MED ORDER — METRONIDAZOLE 500 MG PO TABS: 500.0000 mg | ORAL_TABLET | Freq: Two times a day (BID) | ORAL | 0 refills | Status: DC

## 2017-05-22 ENCOUNTER — Ambulatory Visit (INDEPENDENT_AMBULATORY_CARE_PROVIDER_SITE_OTHER): Payer: Medicaid Other | Admitting: Certified Nurse Midwife

## 2017-05-22 VITALS — BP 122/79 | HR 90 | Wt 221.6 lb

## 2017-05-22 DIAGNOSIS — O09293 Supervision of pregnancy with other poor reproductive or obstetric history, third trimester: Secondary | ICD-10-CM

## 2017-05-22 DIAGNOSIS — Z98891 History of uterine scar from previous surgery: Secondary | ICD-10-CM

## 2017-05-22 DIAGNOSIS — Z3480 Encounter for supervision of other normal pregnancy, unspecified trimester: Secondary | ICD-10-CM

## 2017-05-22 DIAGNOSIS — O09299 Supervision of pregnancy with other poor reproductive or obstetric history, unspecified trimester: Secondary | ICD-10-CM

## 2017-05-22 DIAGNOSIS — R7989 Other specified abnormal findings of blood chemistry: Secondary | ICD-10-CM

## 2017-05-22 NOTE — Progress Notes (Signed)
Patient reports fetal movement and contractions that come and go with pressure.
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
## 2017-05-29 ENCOUNTER — Encounter: Payer: Self-pay | Admitting: Certified Nurse Midwife

## 2017-05-29 ENCOUNTER — Ambulatory Visit (INDEPENDENT_AMBULATORY_CARE_PROVIDER_SITE_OTHER): Payer: Medicaid Other | Admitting: Certified Nurse Midwife

## 2017-05-29 VITALS — BP 130/84 | HR 86 | Wt 224.0 lb

## 2017-05-29 DIAGNOSIS — E559 Vitamin D deficiency, unspecified: Secondary | ICD-10-CM | POA: Diagnosis not present

## 2017-05-29 DIAGNOSIS — O34219 Maternal care for unspecified type scar from previous cesarean delivery: Secondary | ICD-10-CM

## 2017-05-29 DIAGNOSIS — O329XX0 Maternal care for malpresentation of fetus, unspecified, not applicable or unspecified: Secondary | ICD-10-CM | POA: Diagnosis not present

## 2017-05-29 DIAGNOSIS — R7989 Other specified abnormal findings of blood chemistry: Secondary | ICD-10-CM

## 2017-05-29 DIAGNOSIS — Z98891 History of uterine scar from previous surgery: Secondary | ICD-10-CM

## 2017-05-29 DIAGNOSIS — Z3480 Encounter for supervision of other normal pregnancy, unspecified trimester: Secondary | ICD-10-CM

## 2017-05-29 DIAGNOSIS — Z3483 Encounter for supervision of other normal pregnancy, third trimester: Secondary | ICD-10-CM

## 2017-05-29 NOTE — Progress Notes (Signed)
Patient wants to be checked.   Patient wants nexplanon PP
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
## 2017-05-30 ENCOUNTER — Ambulatory Visit (HOSPITAL_COMMUNITY)
Admission: RE | Admit: 2017-05-30 | Discharge: 2017-05-30 | Disposition: A | Discharge status: Home / Self Care | Payer: Medicaid Other | Source: Ambulatory Visit | Attending: Certified Nurse Midwife | Admitting: Certified Nurse Midwife

## 2017-05-30 ENCOUNTER — Telehealth (HOSPITAL_COMMUNITY): Payer: Self-pay | Admitting: *Deleted

## 2017-05-30 ENCOUNTER — Encounter (HOSPITAL_COMMUNITY): Payer: Self-pay | Admitting: *Deleted

## 2017-05-30 ENCOUNTER — Encounter: Payer: Self-pay | Admitting: *Deleted

## 2017-05-30 ENCOUNTER — Telehealth (HOSPITAL_COMMUNITY): Payer: Self-pay

## 2017-05-30 DIAGNOSIS — O09213 Supervision of pregnancy with history of pre-term labor, third trimester: Secondary | ICD-10-CM

## 2017-05-30 DIAGNOSIS — O34211 Maternal care for low transverse scar from previous cesarean delivery: Secondary | ICD-10-CM | POA: Insufficient documentation

## 2017-05-30 DIAGNOSIS — Z3A39 39 weeks gestation of pregnancy: Secondary | ICD-10-CM | POA: Insufficient documentation

## 2017-05-30 DIAGNOSIS — Z98891 History of uterine scar from previous surgery: Secondary | ICD-10-CM

## 2017-05-30 DIAGNOSIS — Z3689 Encounter for other specified antenatal screening: Secondary | ICD-10-CM | POA: Insufficient documentation

## 2017-05-30 DIAGNOSIS — O09293 Supervision of pregnancy with other poor reproductive or obstetric history, third trimester: Secondary | ICD-10-CM | POA: Insufficient documentation

## 2017-05-30 DIAGNOSIS — O329XX0 Maternal care for malpresentation of fetus, unspecified, not applicable or unspecified: Secondary | ICD-10-CM

## 2017-05-30 DIAGNOSIS — Z362 Encounter for other antenatal screening follow-up: Secondary | ICD-10-CM | POA: Insufficient documentation

## 2017-05-30 DIAGNOSIS — Z3480 Encounter for supervision of other normal pregnancy, unspecified trimester: Secondary | ICD-10-CM

## 2017-05-30 NOTE — Telephone Encounter (Signed)
Call patient to give her surgery date and time, no answer, phone just continues to ring, unable to leave a voicemail.

## 2017-05-30 NOTE — Telephone Encounter (Signed)
Patient called back states she received a call from this phone number and wanted to know what it was for.   Advised patient that her surgery had been scheduled for tomorrow 05/31/2017 @0930 . Patients got very angry and began to rant and curse about how she was not coming, she lives a hour away and is not driving back up her tomorrow. She wants her surgery done today. Advised patient that I spoke with Dr. Jolayne Pantheronstant and she is unable/not willing to do her surgery today. Patient hung up on me.   Called and advised Dr. Jolayne Pantheronstant of patient's response.

## 2017-05-30 NOTE — Telephone Encounter (Signed)
Preadmission screen Discussed NPO status and arrival time with pt along with reviewing medical history

## 2017-05-31 ENCOUNTER — Inpatient Hospital Stay (HOSPITAL_COMMUNITY): Payer: Medicaid Other | Admitting: Anesthesiology

## 2017-05-31 ENCOUNTER — Encounter (HOSPITAL_COMMUNITY): Payer: Self-pay

## 2017-05-31 ENCOUNTER — Inpatient Hospital Stay (HOSPITAL_COMMUNITY)
Admission: RE | Admit: 2017-05-31 | Discharge: 2017-06-02 | DRG: 765 | Disposition: A | Discharge status: Home / Self Care | Payer: Medicaid Other | Source: Ambulatory Visit | Attending: Obstetrics and Gynecology | Admitting: Obstetrics and Gynecology

## 2017-05-31 ENCOUNTER — Encounter (HOSPITAL_COMMUNITY)
Admission: RE | Disposition: A | Discharge status: Home / Self Care | Payer: Self-pay | Source: Ambulatory Visit | Attending: Obstetrics and Gynecology

## 2017-05-31 ENCOUNTER — Other Ambulatory Visit: Payer: Self-pay | Admitting: Obstetrics and Gynecology

## 2017-05-31 ENCOUNTER — Other Ambulatory Visit: Payer: Self-pay | Admitting: Advanced Practice Midwife

## 2017-05-31 DIAGNOSIS — Z3A39 39 weeks gestation of pregnancy: Secondary | ICD-10-CM

## 2017-05-31 DIAGNOSIS — O99324 Drug use complicating childbirth: Secondary | ICD-10-CM | POA: Diagnosis not present

## 2017-05-31 DIAGNOSIS — O34211 Maternal care for low transverse scar from previous cesarean delivery: Secondary | ICD-10-CM | POA: Diagnosis not present

## 2017-05-31 DIAGNOSIS — F1290 Cannabis use, unspecified, uncomplicated: Secondary | ICD-10-CM | POA: Diagnosis not present

## 2017-05-31 DIAGNOSIS — Z98891 History of uterine scar from previous surgery: Secondary | ICD-10-CM

## 2017-05-31 DIAGNOSIS — O9A213 Injury, poisoning and certain other consequences of external causes complicating pregnancy, third trimester: Secondary | ICD-10-CM

## 2017-05-31 LAB — CBC
HEMATOCRIT: 38.9 % (ref 36.0–46.0)
HEMOGLOBIN: 13.1 g/dL (ref 12.0–15.0)
MCH: 30.5 pg (ref 26.0–34.0)
MCHC: 33.7 g/dL (ref 30.0–36.0)
MCV: 90.5 fL (ref 78.0–100.0)
Platelets: 171 10*3/uL (ref 150–400)
RBC: 4.3 MIL/uL (ref 3.87–5.11)
RDW: 13.5 % (ref 11.5–15.5)
WBC: 13.8 10*3/uL — ABNORMAL HIGH (ref 4.0–10.5)

## 2017-05-31 LAB — RAPID URINE DRUG SCREEN, HOSP PERFORMED
Amphetamines: NOT DETECTED
BARBITURATES: NOT DETECTED
Benzodiazepines: NOT DETECTED
COCAINE: NOT DETECTED
Opiates: NOT DETECTED
Tetrahydrocannabinol: POSITIVE — AB

## 2017-05-31 LAB — TYPE AND SCREEN
ABO/RH(D): O POS
ANTIBODY SCREEN: NEGATIVE

## 2017-05-31 SURGERY — Surgical Case
Anesthesia: Spinal | Laterality: Bilateral | Wound class: Clean Contaminated

## 2017-05-31 MED ORDER — ONDANSETRON HCL 4 MG/2ML IJ SOLN
INTRAMUSCULAR | Status: DC | PRN
  Administered 2017-05-31: 4 mg via INTRAVENOUS

## 2017-05-31 MED ORDER — BUPIVACAINE IN DEXTROSE 0.75-8.25 % IT SOLN
INTRATHECAL | Status: DC | PRN
  Administered 2017-05-31: 1.5 mL via INTRATHECAL

## 2017-05-31 MED ORDER — TETANUS-DIPHTH-ACELL PERTUSSIS 5-2.5-18.5 LF-MCG/0.5 IM SUSP
0.5000 mL | Freq: Once | INTRAMUSCULAR | Status: DC
  Filled 2017-05-31: qty 0.5

## 2017-05-31 MED ORDER — KETOROLAC TROMETHAMINE 30 MG/ML IJ SOLN
30.0000 mg | Freq: Four times a day (QID) | INTRAMUSCULAR | Status: AC | PRN
  Administered 2017-05-31: 30 mg via INTRAMUSCULAR

## 2017-05-31 MED ORDER — MORPHINE SULFATE (PF) 0.5 MG/ML IJ SOLN
INTRAMUSCULAR | Status: DC | PRN
Start: 2017-05-31 — End: 2017-05-31
  Administered 2017-05-31: .2 mg via INTRATHECAL

## 2017-05-31 MED ORDER — IBUPROFEN 600 MG PO TABS
600.0000 mg | ORAL_TABLET | Freq: Four times a day (QID) | ORAL | Status: DC | PRN
  Administered 2017-05-31 – 2017-06-02 (×5): 600 mg via ORAL
  Filled 2017-05-31 (×6): qty 1

## 2017-05-31 MED ORDER — PHENYLEPHRINE 8 MG IN D5W 100 ML (0.08MG/ML) PREMIX OPTIME
INJECTION | INTRAVENOUS | Status: DC | PRN
  Administered 2017-05-31: 60 ug/min via INTRAVENOUS

## 2017-05-31 MED ORDER — ONDANSETRON HCL 4 MG/2ML IJ SOLN: 4.0000 mg | Freq: Three times a day (TID) | INTRAMUSCULAR | Status: DC | PRN

## 2017-05-31 MED ORDER — FENTANYL CITRATE (PF) 100 MCG/2ML IJ SOLN: 25.0000 ug | INTRAMUSCULAR | Status: DC | PRN

## 2017-05-31 MED ORDER — NALBUPHINE HCL 10 MG/ML IJ SOLN: 5.0000 mg | Freq: Once | INTRAMUSCULAR | Status: DC | PRN

## 2017-05-31 MED ORDER — LACTATED RINGERS IV SOLN
INTRAVENOUS | Status: DC
  Administered 2017-05-31 (×2): via INTRAVENOUS

## 2017-05-31 MED ORDER — LACTATED RINGERS IV SOLN
INTRAVENOUS | Status: DC
  Administered 2017-05-31: 23:00:00 via INTRAVENOUS

## 2017-05-31 MED ORDER — POLYETHYLENE GLYCOL 3350 17 G PO PACK
17.0000 g | PACK | Freq: Every day | ORAL | Status: DC
  Administered 2017-06-02: 17 g via ORAL
  Filled 2017-05-31 (×3): qty 1

## 2017-05-31 MED ORDER — LACTATED RINGERS IV SOLN: INTRAVENOUS | Status: DC

## 2017-05-31 MED ORDER — MEPERIDINE HCL 25 MG/ML IJ SOLN: 6.2500 mg | INTRAMUSCULAR | Status: DC | PRN

## 2017-05-31 MED ORDER — KETOROLAC TROMETHAMINE 30 MG/ML IJ SOLN: 30.0000 mg | Freq: Four times a day (QID) | INTRAMUSCULAR | Status: AC | PRN

## 2017-05-31 MED ORDER — OXYCODONE HCL 5 MG PO TABS
10.0000 mg | ORAL_TABLET | ORAL | Status: DC | PRN
  Administered 2017-06-01 – 2017-06-02 (×3): 10 mg via ORAL
  Filled 2017-05-31 (×3): qty 2

## 2017-05-31 MED ORDER — SCOPOLAMINE 1 MG/3DAYS TD PT72: 1.0000 | MEDICATED_PATCH | Freq: Once | TRANSDERMAL | Status: DC

## 2017-05-31 MED ORDER — NALOXONE HCL 0.4 MG/ML IJ SOLN: 0.4000 mg | INTRAMUSCULAR | Status: DC | PRN

## 2017-05-31 MED ORDER — ONDANSETRON HCL 4 MG/2ML IJ SOLN
INTRAMUSCULAR | Status: AC
  Filled 2017-05-31: qty 2

## 2017-05-31 MED ORDER — COCONUT OIL OIL
1.0000 "application " | TOPICAL_OIL | Status: DC | PRN
  Administered 2017-06-02: 1 via TOPICAL
  Filled 2017-05-31 (×2): qty 120

## 2017-05-31 MED ORDER — BUPIVACAINE IN DEXTROSE 0.75-8.25 % IT SOLN
INTRATHECAL | Status: AC
  Filled 2017-05-31: qty 2

## 2017-05-31 MED ORDER — NALOXONE HCL 2 MG/2ML IJ SOSY
1.0000 ug/kg/h | PREFILLED_SYRINGE | INTRAVENOUS | Status: DC | PRN
  Filled 2017-05-31: qty 2

## 2017-05-31 MED ORDER — SCOPOLAMINE 1 MG/3DAYS TD PT72
MEDICATED_PATCH | TRANSDERMAL | Status: AC
  Filled 2017-05-31: qty 1

## 2017-05-31 MED ORDER — SODIUM CHLORIDE 0.9 % IR SOLN
Status: DC | PRN
  Administered 2017-05-31: 1000 mL

## 2017-05-31 MED ORDER — NALBUPHINE HCL 10 MG/ML IJ SOLN: 5.0000 mg | INTRAMUSCULAR | Status: DC | PRN

## 2017-05-31 MED ORDER — ACETAMINOPHEN 325 MG PO TABS
650.0000 mg | ORAL_TABLET | ORAL | Status: DC | PRN
  Administered 2017-05-31 – 2017-06-02 (×3): 650 mg via ORAL
  Filled 2017-05-31 (×3): qty 2

## 2017-05-31 MED ORDER — DIPHENHYDRAMINE HCL 50 MG/ML IJ SOLN: 12.5000 mg | INTRAMUSCULAR | Status: DC | PRN

## 2017-05-31 MED ORDER — SENNOSIDES-DOCUSATE SODIUM 8.6-50 MG PO TABS
2.0000 | ORAL_TABLET | Freq: Every evening | ORAL | Status: DC | PRN
  Filled 2017-05-31: qty 2

## 2017-05-31 MED ORDER — LACTATED RINGERS IV SOLN
INTRAVENOUS | Status: DC | PRN
  Administered 2017-05-31: 12:00:00 via INTRAVENOUS

## 2017-05-31 MED ORDER — DIPHENHYDRAMINE HCL 25 MG PO CAPS
25.0000 mg | ORAL_CAPSULE | Freq: Four times a day (QID) | ORAL | Status: DC | PRN
  Filled 2017-05-31: qty 1

## 2017-05-31 MED ORDER — METOCLOPRAMIDE HCL 5 MG/ML IJ SOLN: 10.0000 mg | Freq: Once | INTRAMUSCULAR | Status: DC | PRN

## 2017-05-31 MED ORDER — SODIUM CHLORIDE 0.9% FLUSH: 3.0000 mL | INTRAVENOUS | Status: DC | PRN

## 2017-05-31 MED ORDER — MENTHOL 3 MG MT LOZG
1.0000 | LOZENGE | OROMUCOSAL | Status: DC | PRN
  Filled 2017-05-31: qty 9

## 2017-05-31 MED ORDER — DIBUCAINE 1 % RE OINT
1.0000 "application " | TOPICAL_OINTMENT | RECTAL | Status: DC | PRN
  Filled 2017-05-31: qty 28

## 2017-05-31 MED ORDER — EPHEDRINE 5 MG/ML INJ
INTRAVENOUS | Status: AC
  Filled 2017-05-31: qty 10

## 2017-05-31 MED ORDER — MORPHINE SULFATE (PF) 0.5 MG/ML IJ SOLN
INTRAMUSCULAR | Status: AC
  Filled 2017-05-31: qty 10

## 2017-05-31 MED ORDER — CEFAZOLIN SODIUM-DEXTROSE 2-4 GM/100ML-% IV SOLN
2.0000 g | INTRAVENOUS | Status: AC
  Administered 2017-05-31: 2 g via INTRAVENOUS
  Filled 2017-05-31: qty 100

## 2017-05-31 MED ORDER — SOD CITRATE-CITRIC ACID 500-334 MG/5ML PO SOLN
30.0000 mL | ORAL | Status: AC
  Administered 2017-05-31: 30 mL via ORAL
  Filled 2017-05-31: qty 15

## 2017-05-31 MED ORDER — OXYCODONE HCL 5 MG PO TABS
5.0000 mg | ORAL_TABLET | ORAL | Status: DC | PRN
  Administered 2017-06-01 – 2017-06-02 (×5): 5 mg via ORAL
  Filled 2017-05-31 (×5): qty 1

## 2017-05-31 MED ORDER — DIPHENHYDRAMINE HCL 25 MG PO CAPS
25.0000 mg | ORAL_CAPSULE | ORAL | Status: DC | PRN
  Filled 2017-05-31: qty 1

## 2017-05-31 MED ORDER — WITCH HAZEL-GLYCERIN EX PADS: 1.0000 "application " | MEDICATED_PAD | CUTANEOUS | Status: DC | PRN

## 2017-05-31 MED ORDER — OXYTOCIN 40 UNITS IN LACTATED RINGERS INFUSION - SIMPLE MED: 2.5000 [IU]/h | INTRAVENOUS | Status: AC

## 2017-05-31 MED ORDER — OXYTOCIN 10 UNIT/ML IJ SOLN
INTRAMUSCULAR | Status: AC
  Filled 2017-05-31: qty 4

## 2017-05-31 MED ORDER — EPHEDRINE SULFATE 50 MG/ML IJ SOLN
INTRAMUSCULAR | Status: DC | PRN
  Administered 2017-05-31: 10 mg via INTRAVENOUS

## 2017-05-31 MED ORDER — FENTANYL CITRATE (PF) 100 MCG/2ML IJ SOLN
INTRAMUSCULAR | Status: DC | PRN
  Administered 2017-05-31: 10 ug via INTRATHECAL

## 2017-05-31 MED ORDER — PRENATAL MULTIVITAMIN CH
1.0000 | ORAL_TABLET | Freq: Every day | ORAL | Status: DC
  Administered 2017-06-01 – 2017-06-02 (×2): 1 via ORAL
  Filled 2017-05-31 (×5): qty 1

## 2017-05-31 MED ORDER — FENTANYL CITRATE (PF) 100 MCG/2ML IJ SOLN
INTRAMUSCULAR | Status: AC
  Filled 2017-05-31: qty 2

## 2017-05-31 MED ORDER — SCOPOLAMINE 1 MG/3DAYS TD PT72
MEDICATED_PATCH | TRANSDERMAL | Status: DC | PRN
  Administered 2017-05-31: 1 via TRANSDERMAL

## 2017-05-31 MED ORDER — SIMETHICONE 80 MG PO CHEW
80.0000 mg | CHEWABLE_TABLET | Freq: Three times a day (TID) | ORAL | Status: DC
  Administered 2017-05-31 – 2017-06-02 (×4): 80 mg via ORAL
  Filled 2017-05-31 (×9): qty 1

## 2017-05-31 MED ORDER — KETOROLAC TROMETHAMINE 30 MG/ML IJ SOLN
INTRAMUSCULAR | Status: AC
  Filled 2017-05-31: qty 1

## 2017-05-31 MED ORDER — OXYTOCIN 10 UNIT/ML IJ SOLN
INTRAVENOUS | Status: DC | PRN
  Administered 2017-05-31: 40 [IU] via INTRAVENOUS

## 2017-05-31 SURGICAL SUPPLY — 39 items
BENZOIN TINCTURE PRP APPL 2/3 (GAUZE/BANDAGES/DRESSINGS) ×3 IMPLANT
CANISTER SUCT 3000ML PPV (MISCELLANEOUS) ×3 IMPLANT
CHLORAPREP W/TINT 26ML (MISCELLANEOUS) ×3 IMPLANT
CLOSURE WOUND 1/2 X4 (GAUZE/BANDAGES/DRESSINGS) ×1
DERMABOND ADVANCED (GAUZE/BANDAGES/DRESSINGS) ×2
DERMABOND ADVANCED .7 DNX12 (GAUZE/BANDAGES/DRESSINGS) ×1 IMPLANT
DRSG OPSITE POSTOP 4X10 (GAUZE/BANDAGES/DRESSINGS) ×3 IMPLANT
ELECT REM PT RETURN 9FT ADLT (ELECTROSURGICAL) ×3
ELECTRODE REM PT RTRN 9FT ADLT (ELECTROSURGICAL) ×1 IMPLANT
EXTRACTOR VACUUM KIWI (MISCELLANEOUS) ×6 IMPLANT
GLOVE BIOGEL PI IND STRL 7.0 (GLOVE) ×2 IMPLANT
GLOVE BIOGEL PI INDICATOR 7.0 (GLOVE) ×4
GLOVE INDICATOR 7.5 STRL GRN (GLOVE) ×3 IMPLANT
GLOVE SKINSENSE NS SZ7.0 (GLOVE) ×2
GLOVE SKINSENSE STRL SZ7.0 (GLOVE) ×1 IMPLANT
GOWN STRL REUS W/ TWL LRG LVL3 (GOWN DISPOSABLE) ×2 IMPLANT
GOWN STRL REUS W/ TWL XL LVL3 (GOWN DISPOSABLE) ×1 IMPLANT
GOWN STRL REUS W/TWL LRG LVL3 (GOWN DISPOSABLE) ×4
GOWN STRL REUS W/TWL XL LVL3 (GOWN DISPOSABLE) ×2
NS IRRIG 1000ML POUR BTL (IV SOLUTION) ×3 IMPLANT
PACK C SECTION WH (CUSTOM PROCEDURE TRAY) ×3 IMPLANT
PAD ABD 7.5X8 STRL (GAUZE/BANDAGES/DRESSINGS) ×3 IMPLANT
PAD OB MATERNITY 4.3X12.25 (PERSONAL CARE ITEMS) ×3 IMPLANT
PAD PREP 24X48 CUFFED NSTRL (MISCELLANEOUS) ×3 IMPLANT
RETRACTOR WND ALEXIS 25 LRG (MISCELLANEOUS) ×1 IMPLANT
RTRCTR WOUND ALEXIS 25CM LRG (MISCELLANEOUS) ×3
STRIP CLOSURE SKIN 1/2X4 (GAUZE/BANDAGES/DRESSINGS) ×2 IMPLANT
SUT MON AB 4-0 PS1 27 (SUTURE) ×3 IMPLANT
SUT MON AB-0 CT1 36 (SUTURE) ×12 IMPLANT
SUT PLAIN 0 NONE (SUTURE) IMPLANT
SUT PLAIN 2 0 (SUTURE) ×2
SUT PLAIN 2 0 XLH (SUTURE) ×6 IMPLANT
SUT PLAIN ABS 2-0 CT1 27XMFL (SUTURE) ×1 IMPLANT
SUT VIC AB 0 CT1 36 (SUTURE) ×6 IMPLANT
SUT VIC AB 3-0 CT1 27 (SUTURE) ×2
SUT VIC AB 3-0 CT1 TAPERPNT 27 (SUTURE) ×1 IMPLANT
SUT VICRYL 2 0 18  TIES (SUTURE)
SUT VICRYL 2 0 18 TIES (SUTURE) IMPLANT
TOWEL OR 17X24 6PK STRL BLUE (TOWEL DISPOSABLE) ×6 IMPLANT

## 2017-05-31 NOTE — Anesthesia Preprocedure Evaluation (Signed)
Anesthesia Evaluation  Patient identified by MRN, date of birth, ID band Patient awake    Reviewed: Allergy & Precautions, NPO status , Patient's Chart, lab work & pertinent test results  Airway Mallampati: II  TM Distance: >3 FB Neck ROM: Full    Dental no notable dental hx.    Pulmonary neg pulmonary ROS,    Pulmonary exam normal breath sounds clear to auscultation       Cardiovascular hypertension, negative cardio ROS Normal cardiovascular exam Rhythm:Regular Rate:Normal     Neuro/Psych negative neurological ROS  negative psych ROS   GI/Hepatic negative GI ROS, Neg liver ROS,   Endo/Other  negative endocrine ROS  Renal/GU negative Renal ROS  negative genitourinary   Musculoskeletal negative musculoskeletal ROS (+)   Abdominal   Peds negative pediatric ROS (+)  Hematology negative hematology ROS (+)   Anesthesia Other Findings   Reproductive/Obstetrics (+) Pregnancy                             Anesthesia Physical Anesthesia Plan  ASA: II  Anesthesia Plan: Spinal   Post-op Pain Management:    Induction:   PONV Risk Score and Plan: 2 and Ondansetron  Airway Management Planned: Natural Airway  Additional Equipment:   Intra-op Plan:   Post-operative Plan:   Informed Consent: I have reviewed the patients History and Physical, chart, labs and discussed the procedure including the risks, benefits and alternatives for the proposed anesthesia with the patient or authorized representative who has indicated his/her understanding and acceptance.   Dental advisory given  Plan Discussed with:   Anesthesia Plan Comments:         Anesthesia Quick Evaluation

## 2017-05-31 NOTE — Lactation Note (Signed)
This note was copied from a baby's chart. Lactation Consultation Note  Patient Name: Lori Delories HeinzHaylie Voges ONGEX'BToday's Date: 05/31/2017 Reason for consult: Initial assessment  Baby 9 hours old. Mom reports baby nursing well and she is hearing swallows at the breast. Mom states baby is cueing to nurse every 3 hours. Mom asked for cloth breast pads, offered disposable, but mom declined.   Maternal Data Does the patient have breastfeeding experience prior to this delivery?: Yes  Feeding Feeding Type: Breast Fed Length of feed: 15 min  LATCH Score/Interventions Latch: Repeated attempts needed to sustain latch, nipple held in mouth throughout feeding, stimulation needed to elicit sucking reflex. Intervention(s): Adjust position;Assist with latch;Breast massage  Audible Swallowing: A few with stimulation Intervention(s): Hand expression  Type of Nipple: Everted at rest and after stimulation  Comfort (Breast/Nipple): Soft / non-tender     Hold (Positioning): Assistance needed to correctly position infant at breast and maintain latch.  LATCH Score: 7  Lactation Tools Discussed/Used     Consult Status Consult Status: Follow-up Date: 06/01/17 Follow-up type: In-patient    Lori Mcmahon 05/31/2017, 9:18 PM

## 2017-05-31 NOTE — Op Note (Signed)
Operative Note   SURGERY DATE: 05/31/2017  PRE-OP DIAGNOSIS:  *Pregnancy @ 39/1 *Patient desire for repeat cesarean section  POST-OP DIAGNOSIS: Same. Delivered   PROCEDURE: repeat low transverse cesarean section via pfannenstiel skin incision with double layer uterine closure  SURGEON: Surgeon(s) and Role:    [place] Bing* Kare Dado, MD - Primary  ASSISTANT: None  ANESTHESIA: spinal  ESTIMATED BLOOD LOSS: 600mL  DRAINS: 300mL UOP via indwelling foley  TOTAL IV FLUIDS: 2200mL crystalloid  VTE PROPHYLAXIS: SCDs to bilateral lower extremities  ANTIBIOTICS: Two grams of Cefazolin were given., within 1 hour of skin incision  SPECIMENS: placenta to path  COMPLICATIONS: tight fascial/peritoneal opening  FINDINGS: No intra-abdominal adhesions were noted. Grossly normal uterus, tubes and ovaries. Clear amniotic fluid, cephalic female infant, weight 2540gm, APGARs 8/9, intact placenta.  PROCEDURE IN DETAIL: The patient was taken to the operating room where anesthesia was administered and normal fetal heart tones were confirmed. She was then prepped and draped in the normal fashion in the dorsal supine position with a leftward tilt.  After a time out was performed, a pfannensteil  skin incision was made with the scalpel and carried through to the underlying layer of fascia. The fascia was then incised at the midline and this incision was extended laterally with the mayo scissors. Attention was turned to the superior aspect of the fascial incision which was grasped with the kocher clamps x 2, tented up and the rectus muscles were dissected off with the scalpel. In a similar fashion the inferior aspect of the fascial incision was grasped with the kocher clamps, tented up and the rectus muscles dissected off with the mayo scissors. The rectus muscles were then separated in the midline and the peritoneum was entered bluntly. The Alexis retractor was inserted and the vesicouterine peritoneum was  identified, tented up and entered with the metzenbaum scissors. This incision was extended laterally and the bladder flap was created digitally.  A low transverse hysterotomy was made with the scalpel until the endometrial cavity was breached and the amniotic sac ruptured with the Allis clamp, yielding clear amniotic fluid. This incision was extended bluntly and the infant's head was delivered to the hysterotomy and skin opening. A Kiwi vacuum was placed and used in the standard way but popped off x 3. The Jon Gillslexis was removed and a large Richardson and bladder placed. Using bandage scissors, the fascial/muscle and peritoneal incision was made bilaterally to open incision more.  The head, shoulders and body were then delivered w/o issue. .The cord was clamped x 2 and cut, and the infant was handed to the awaiting pediatricians, after delayed cord clamping was done.  The placenta was then gradually expressed from the uterus and then the uterus was exteriorized and cleared of all clots and debris. The hysterotomy was repaired with a running suture of 1-0 monocryl. A second imbricating layer of 1-0 monocrll suture was then placed. Several figure-of-eight sutures of 1-0 monocryl were added to achieve excellent hemostasis.   The uterus and adnexa were then returned to the abdomen, and the hysterotomy and all operative sites were reinspected and excellent hemostasis was noted after irrigation and suction of the abdomen with warm saline.  The fascia was reapproximated with 0 Vicryl in a simple running fashion bilaterally. The subcutaneous layer was then reapproximated with interrupted sutures of 2-0 plain gut, and the skin was then closed with 4-0 monocryl, in a subcuticular fashion.  The patient  tolerated the procedure well. Sponge, lap, needle, and instrument counts  were correct x 2. The patient was transferred to the recovery room awake, alert and breathing independently in stable condition.  Cornelia Copa MD Attending Center for [facility])

## 2017-05-31 NOTE — Anesthesia Postprocedure Evaluation (Signed)
Anesthesia Post Note  Patient: Lori Mcmahon  Procedure(s) Performed: Procedure(s) (LRB): CESAREAN SECTION WITH BILATERAL TUBAL LIGATION (Bilateral)     Patient location during evaluation: Mother Baby Anesthesia Type: Spinal Level of consciousness: awake Pain management: pain level controlled Vital Signs Assessment: post-procedure vital signs reviewed and stable Respiratory status: spontaneous breathing Cardiovascular status: stable Postop Assessment: no headache, no backache, spinal receding, patient able to bend at knees, no signs of nausea or vomiting and adequate PO intake Anesthetic complications: no    Last Vitals:  Vitals:   05/31/17 1444 05/31/17 1456  BP:  120/66  Pulse:  (!) 59  Resp:  16  Temp: 36.6 C     Last Pain:  Vitals:   05/31/17 1455  TempSrc:   PainSc: 0-No pain   Pain Goal:                 Tevin Shillingford

## 2017-05-31 NOTE — H&P (Signed)
Obstetrics Admission History & Physical  05/31/2017 - 10:22 AM Primary OBGYN: [facility]  Chief Complaint: scheduled c-section  History of Present Illness  26 y.o. [id number] @ 7578w1d, with the above CC. Pregnancy complicated by: h/o prior c-section, BMI 31, h/o THC use, h/o pre-x.  Ms. Delories HeinzHaylie Hargreaves states that she is doing well and no issues or problems. NPO since before MN.   Review of Systems:  as noted in the History of Present Illness.  PMHx:  Past Medical History:  Diagnosis Date  . Low blood potassium   . Pregnancy induced hypertension    with first pregnancy   PSHx:  Past Surgical History:  Procedure Laterality Date  . CESAREAN SECTION N/A 03/02/2016   Procedure: CESAREAN SECTION;  Surgeon: Waynard ReedsKendra Ross, MD;  Location: [facility];  Service: Obstetrics;  Laterality: N/A;  . KNEE SURGERY    . TONSILLECTOMY     Medications:  Prescriptions Prior to Admission  Medication Sig Dispense Refill Last Dose  . acetaminophen (TYLENOL) 325 MG tablet Take 650 mg by mouth every 4 (four) hours as needed for headache. Reported on 01/04/2016   05/30/2017 at Unknown time  . Prenat-FeAsp-Meth-FA-DHA w/o A (PRENATE PIXIE) 10-0.6-0.4-200 MG CAPS Take 1 tablet by mouth daily. 30 capsule 12 05/30/2017 at Unknown time     Allergies: is allergic to ultram [tramadol]. OBHx:  OB History  Gravida Para Term Preterm AB Living  3 2 1 1   2   SAB TAB Ectopic Multiple Live Births        0 2    # Outcome Date GA Lbr Len/2nd Weight Sex Delivery Anes PTL Lv  3 Current           2 Preterm 03/02/16 2568w6d  1.96 kg (4 lb 5.1 oz) F CS-LTranv Gen  LIV  1 Term 05/01/13 7378w1d 12:48 / 00:18 3.32 kg (7 lb 5.1 oz) M Vag-Spont EPI  LIV             FHx:  Family History  Problem Relation Age of Onset  . Other Mother        Preeclampsia in labor  . COPD Maternal Grandmother    Soc Hx:  Social History   Social History  . Marital status: Single    Spouse name: N/A  . Number of children: N/A   . Years of education: N/A   Occupational History  . Not on file.   Social History Main Topics  . Smoking status: Never Smoker  . Smokeless tobacco: Never Used  . Alcohol use No     Comment: stopped 1 1/2 months ago, would smoke 1-2 times a day  . Drug use: Yes    Types: Marijuana     Comment: Last marijuana 04/16/17, uses for nausea  . Sexual activity: Yes    Birth control/ protection: None   Other Topics Concern  . Not on file   Social History Narrative  . No narrative on file    Objective    Current Vital Signs 24h Vital Sign Ranges  T 99.2 F (37.3 C) Temp  Avg: 99.2 F (37.3 C)  Min: 99.2 F (37.3 C)  Max: 99.2 F (37.3 C)  BP 124/77 BP  Min: 124/77  Max: 124/77  HR 81 Pulse  Avg: 81  Min: 81  Max: 81  RR 15 Resp  Avg: 15  Min: 15  Max: 15  SaO2     No Data Recorded  24 Hour I/O Current Shift I/O  Time Ins Outs No intake/output data recorded. No intake/output data recorded.   FHT: normal  General: Well nourished, well developed female in no acute distress.  Skin:  Warm and dry.  Cardiovascular: S1, S2 normal, no murmur, rub or gallop, regular rate and rhythm Respiratory:  Clear to auscultation bilateral. Normal respiratory effort Abdomen: obese, nttp, gravid, well healed low transverse skin incision.  Neuro/Psych:  Normal mood and affect.   Labs  ABO, UDS pending  Recent Labs Lab 05/31/17 0945  WBC 13.8*  HGB 13.1  HCT 38.9  PLT 171    Radiology 6/15: ceph, AFI 11, 3065g, AFI 40%, normal AC, anterior/fundal placenta  Perinatal info  O POS/ Rubella  Immune / Varicella Immune/RPR pending/HIV negative/HepB Surf Ag negative/TDaP:declined /pap neg 2018/  Assessment & Plan   26 y.o. [id number] @ [redacted]w[redacted]d and doing well. *Pregnancy: pt desires rpt c-section after surgical r/b/a d/w pt. She does NOT want a BTL. Nexplanon *h/o prior c-section: see above. Can proceed when OR is ready and labs are back *h/o THC use: UDS ordered *GBS: neg  Cornelia Copa MD Attending Center for [facility])

## 2017-05-31 NOTE — Progress Notes (Signed)
Opened in error

## 2017-05-31 NOTE — Transfer of Care (Signed)
Immediate Anesthesia Transfer of Care Note  Patient: Lori Mcmahon  Procedure(s) Performed: Procedure(s): CESAREAN SECTION WITH BILATERAL TUBAL LIGATION (Bilateral)  Patient Location: PACU  Anesthesia Type:Spinal  Level of Consciousness: awake and alert   Airway & Oxygen Therapy: Patient Spontanous Breathing  Post-op Assessment: Report given to RN and Post -op Vital signs reviewed and stable  Post vital signs: Reviewed  Last Vitals:  Vitals:   05/31/17 0955  BP: 124/77  Pulse: 81  Resp: 15  Temp: 37.3 C    Last Pain:  Vitals:   05/31/17 0956  TempSrc:   PainSc: 0-No pain         Complications: No apparent anesthesia complications

## 2017-05-31 NOTE — Anesthesia Procedure Notes (Signed)
Spinal  Patient location during procedure: OR Staffing Anesthesiologist: Erian Rosengren Performed: anesthesiologist  Preanesthetic Checklist Completed: patient identified, site marked, surgical consent, pre-op evaluation, timeout performed, IV checked, risks and benefits discussed and monitors and equipment checked Spinal Block Patient position: sitting Prep: DuraPrep Patient monitoring: heart rate, continuous pulse ox and blood pressure Approach: right paramedian Location: L4-5 Injection technique: single-shot Needle Needle type: Sprotte  Needle gauge: 24 G Needle length: 9 cm Additional Notes Expiration date of kit checked and confirmed. Patient tolerated procedure well, without complications.       

## 2017-06-01 ENCOUNTER — Encounter (HOSPITAL_COMMUNITY): Payer: Self-pay | Admitting: Obstetrics and Gynecology

## 2017-06-01 LAB — RPR: RPR: NONREACTIVE

## 2017-06-01 LAB — CBC
HCT: 32.9 % — ABNORMAL LOW (ref 36.0–46.0)
HEMOGLOBIN: 11 g/dL — AB (ref 12.0–15.0)
MCH: 30.7 pg (ref 26.0–34.0)
MCHC: 33.4 g/dL (ref 30.0–36.0)
MCV: 91.9 fL (ref 78.0–100.0)
Platelets: 121 10*3/uL — ABNORMAL LOW (ref 150–400)
RBC: 3.58 MIL/uL — ABNORMAL LOW (ref 3.87–5.11)
RDW: 13.7 % (ref 11.5–15.5)
WBC: 10.1 10*3/uL (ref 4.0–10.5)

## 2017-06-01 NOTE — Progress Notes (Signed)
Subjective: Postpartum Day 1: Cesarean Delivery Patient reports incisional pain, tolerating PO and no problems voiding.  No flatus yet.  Objective: Vital signs in last 24 hours: Temp:  [97.5 F (36.4 C)-99.2 F (37.3 C)] 98.6 F (37 C) (06/17 0630) Pulse Rate:  [56-81] 59 (06/17 0630) Resp:  [14-25] 20 (06/17 0630) BP: (104-124)/(53-86) 104/67 (06/17 0630) SpO2:  [98 %-100 %] 100 % (06/16 1750) Weight:  [223 lb (101.2 kg)] 223 lb (101.2 kg) (06/16 0955)  Physical Exam:  General: alert and no distress Lochia: appropriate Uterine Fundus: firm Incision: healing well, old serosanguinous drainage noted. DVT Evaluation: No evidence of DVT seen on physical exam. Negative Homan's sign.   Recent Labs  05/31/17 0945 06/01/17 0615  HGB 13.1 11.0*  HCT 38.9 32.9*    Assessment/Plan: Status post Cesarean section. Doing well postoperatively.  Desires Nexplanon for contraception. Breastfeeding. Continue current care.  Jaynie CollinsUgonna Jozelyn Kuwahara, MD 06/01/2017, 9:22 AM

## 2017-06-01 NOTE — Lactation Note (Signed)
This note was copied from a baby's chart. Lactation Consultation Note  Patient Name: Lori Delories HeinzHaylie Pais VQQVZ'DToday's Date: 06/01/2017 Reason for consult: Follow-up assessment;Infant < 6lbs Mom states baby has been cluster feeding all AM.  Baby currently latched and nursing actively with audible swallows.  Encouraged to call with concerns/assist.  Maternal Data    Feeding Feeding Type: Breast Fed Length of feed: 60 min  LATCH Score/Interventions Latch: Grasps breast easily, tongue down, lips flanged, rhythmical sucking. Intervention(s): Adjust position;Assist with latch  Audible Swallowing: A few with stimulation Intervention(s): Skin to skin;Hand expression Intervention(s): Skin to skin;Hand expression  Type of Nipple: Everted at rest and after stimulation  Comfort (Breast/Nipple): Soft / non-tender  Problem noted: Mild/Moderate discomfort Interventions (Mild/moderate discomfort): Hand massage;Hand expression  Hold (Positioning): No assistance needed to correctly position infant at breast.  LATCH Score: 9  Lactation Tools Discussed/Used     Consult Status Consult Status: Follow-up Date: 06/02/17 Follow-up type: In-patient    Huston FoleyMOULDEN, Kien Mirsky S 06/01/2017, 11:25 AM

## 2017-06-02 MED ORDER — OXYCODONE HCL 5 MG PO TABS: 5.0000 mg | ORAL_TABLET | ORAL | 0 refills | Status: DC | PRN

## 2017-06-02 MED ORDER — IBUPROFEN 600 MG PO TABS: 600.0000 mg | ORAL_TABLET | Freq: Four times a day (QID) | ORAL | 2 refills | Status: AC | PRN

## 2017-06-02 NOTE — Progress Notes (Signed)
Subjective: Postpartum Day #2: Cesarean Delivery Patient reports incisional pain, tolerating PO, + flatus and no problems voiding.    Objective: Vital signs in last 24 hours: Temp:  [97.8 F (36.6 C)-98.2 F (36.8 C)] 97.8 F (36.6 C) (06/18 0557) Pulse Rate:  [57-59] 57 (06/18 0557) Resp:  [18] 18 (06/18 0557) BP: (102-115)/(65-67) 102/67 (06/18 0557)  Physical Exam:  General: alert, cooperative and no distress Lochia: appropriate Uterine Fundus: firm Incision: no significant drainage, no dehiscence, no significant erythema DVT Evaluation: No evidence of DVT seen on physical exam. No cords or calf tenderness. No significant calf/ankle edema.   Recent Labs  05/31/17 0945 06/01/17 0615  HGB 13.1 11.0*  HCT 38.9 32.9*    Assessment/Plan: Status post Cesarean section. Doing well postoperatively.  Discharge home with standard precautions and return to clinic in 4-6 weeks.  Roe Coombsachelle A Deontrey Massi, CNM 06/02/2017, 7:29 AM

## 2017-06-02 NOTE — Lactation Note (Signed)
This note was copied from a baby's chart. Lactation Consultation Note  Patient Name: Lori Mcmahon ZOXWR'UToday's Date: 06/02/2017 Reason for consult: Follow-up assessment;Infant weight loss (7% weight loss, LC uoadted doc flow sheets ) Baby is 5349 hours old and is for D/C today.  LC reviewed and updated the doc flow sheets per mom.  Unable to see a latch today, baby recently breast fed per mom and feels the baby is breast feeding well and often. Per mom breast are getting fuller and heavier, denies soreness or issues with latching.  Sore nipple and engorgement prevention and tx reviewed.  Per mom has a hand pump and a DEBP at home , and I usually don't pump in the early part of breast feeding.  LC discussed the <6 pounds,7% weight loss, and recommended either add extra post pumping to day when the baby  Isn't cluster feeding , or if she decided not to post pump today to add it tomorrow if the weight loss isn't stable.  LC stressed the importance of establish and protect milk supply . LC also stress the importance of STS feedings until the baby can stay awake for feedings  and weight is back to birth  Weight.  Mother informed of post-discharge support and given phone number to the lactation department, including services for phone call assistance; out-patient appointments; and breastfeeding support group. List of other breastfeeding resources in the community given in the handout. Encouraged mother to call for problems or concerns related to breastfeeding.  Maternal Data    Feeding - [facility]  Feeding Type:  (per mom the baby recently breast fed ) Length of feed: 20 min (per mom )  LATCH Score/Interventions Latch: Grasps breast easily, tongue down, lips flanged, rhythmical sucking.  Audible Swallowing: Spontaneous and intermittent  Type of Nipple: Everted at rest and after stimulation  Comfort (Breast/Nipple): Filling, red/small blisters or bruises,  mild/mod discomfort  Problem noted: Mild/Moderate discomfort Interventions (Mild/moderate discomfort):  (coconut oil instead of lanolin)  Hold (Positioning): No assistance needed to correctly position infant at breast.  LATCH Score: 9  Lactation Tools Discussed/Used WIC Program: Yes   Consult Status Consult Status: Complete Date: 06/02/17    Lori Mcmahon 06/02/2017, 1:48 PM

## 2017-06-02 NOTE — Discharge Summary (Signed)
OB Discharge Summary     Patient Name: Lori Mcmahon DOB: February 15, 1991 MRN: [id number]  Date of admission: 05/31/2017 Delivering MD: [place] BingPICKENS, CHARLIE   Date of discharge: 06/02/2017  Admitting diagnosis: RCS Undesired Fertility Intrauterine pregnancy: 2474w1d     Secondary diagnosis:  Active Problems:   History of cesarean delivery  Additional problems: THC use during pregnancy, hx Pre-eclampsia, obese     Discharge diagnosis: Term Pregnancy Delivered, RLTCS                                                                                                Post partum procedures:none  Augmentation: none  Complications: None  Hospital course:  Sceduled C/S   26 y.o. yo G3P2103 at 7374w1d was admitted to the hospital 05/31/2017 for scheduled cesarean section with the following indication:Elective Repeat.  Membrane Rupture Time/Date: 11:59 AM ,05/31/2017   Patient delivered a Viable infant.05/31/2017  Details of operation can be found in separate operative note.  Pateint had an uncomplicated postpartum course.  She is ambulating, tolerating a regular diet, passing flatus, and urinating well. Patient is discharged home in stable condition on  06/02/17         Physical exam  Vitals:   06/01/17 0229 06/01/17 0630 06/01/17 1850 06/02/17 0557  BP: (!) 111/53 104/67 115/65 102/67  Pulse: 63 (!) 59 (!) 59 (!) 57  Resp: 20 20 18 18   Temp: 97.7 F (36.5 C) 98.6 F (37 C) 98.2 F (36.8 C) 97.8 F (36.6 C)  TempSrc: Oral Oral Oral Oral  SpO2:      Weight:      Height:       General: alert, cooperative and no distress Lochia: appropriate Uterine Fundus: firm Incision: Dressing is clean, dry, and intact DVT Evaluation: No evidence of DVT seen on physical exam. No cords or calf tenderness. No significant calf/ankle edema. Labs: Lab Results  Component Value Date   WBC 10.1 06/01/2017   HGB 11.0 (L) 06/01/2017   HCT 32.9 (L) 06/01/2017   MCV 91.9 06/01/2017   PLT 121 (L) 06/01/2017    CMP Latest Ref Rng & Units 03/02/2016  Glucose 65 - 99 mg/dL 83  BUN 6 - 20 mg/dL 7  Creatinine 5.280.44 - 4.131.00 mg/dL 2.440.57  Sodium 010135 - 272145 mmol/L 139  Potassium 3.5 - 5.1 mmol/L 3.1(L)  Chloride 101 - 111 mmol/L 108  CO2 22 - 32 mmol/L 19(L)  Calcium 8.9 - 10.3 mg/dL 8.2(L)  Total Protein 6.5 - 8.1 g/dL 6.7  Total Bilirubin 0.3 - 1.2 mg/dL 5.3(G0.2(L)  Alkaline Phos 38 - 126 U/L 102  AST 15 - 41 U/L 11(L)  ALT 14 - 54 U/L 13(L)    Discharge instruction: per After Visit Summary and "Baby and Me Booklet".  After visit meds:  Allergies as of 06/02/2017      Reactions   Ultram [tramadol] Hives, Itching      Medication List    STOP taking these medications   DICLEGIS 10-10 MG Tbec Generic drug:  Doxylamine-Pyridoxine     TAKE these medications   acetaminophen 325 MG tablet  Commonly known as:  TYLENOL Take 650 mg by mouth every 4 (four) hours as needed for mild pain, moderate pain, fever or headache.   ibuprofen 600 MG tablet Commonly known as:  ADVIL,MOTRIN Take 1 tablet (600 mg total) by mouth every 6 (six) hours as needed for mild pain or cramping.   oxyCODONE 5 MG immediate release tablet Commonly known as:  Oxy IR/ROXICODONE Take 1 tablet (5 mg total) by mouth every 4 (four) hours as needed (pain scale 4-7).   PRENATE PIXIE 10-0.6-0.4-200 MG Caps Take 1 capsule by mouth at bedtime.       Diet: routine diet  Activity: Advance as tolerated. Pelvic rest for 6 weeks.   Outpatient follow up:2 week incision check Follow up Appt:Future Appointments Date Time Provider Department Center  06/05/2017 2:30 PM Orvilla Cornwall A, CNM [facility] None   Follow up Visit:No Follow-up on file.  Postpartum contraception: Nexplanon  Newborn Data: Live born female  Birth Weight: 5 lb 9.6 oz (2540 g) APGAR: 8, 9  Baby Feeding: Breast Disposition:home with mother   06/02/2017 Roe Coombs, CNM
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
## 2017-06-05 ENCOUNTER — Encounter: Payer: Medicaid Other | Admitting: Certified Nurse Midwife

## 2017-06-10 ENCOUNTER — Other Ambulatory Visit: Payer: Self-pay | Admitting: Certified Nurse Midwife

## 2017-06-10 ENCOUNTER — Telehealth: Payer: Self-pay

## 2017-06-10 DIAGNOSIS — G8918 Other acute postprocedural pain: Secondary | ICD-10-CM

## 2017-06-10 MED ORDER — OXYCODONE HCL 5 MG PO TABS: 5.0000 mg | ORAL_TABLET | ORAL | 0 refills | Status: AC | PRN

## 2017-06-10 NOTE — Telephone Encounter (Signed)
Received VM from pt recent c/s c/o pain and incision site looks abnormal. Unable to LVM.

## 2017-06-10 NOTE — Telephone Encounter (Signed)
Pt called back. She is having pain at the c/s site. She is having to tend to a one and four year old. Pt states the pain is so intense, it's difficult for her to get around. She requests rf on Oxycodone. Informed pt I will consult with provider.  Pt also states the bandage is coming off at the incision site. She said one looks like a honeycomb and there's a clear white sticky bandage on top that's sticking to her thigh. Pt informed to remove both dressings but keep the sutures on. Pt denies open wound. After consulting with CNM, 15 pain pills approved. Pt reminded to take only when pain 4-7 and rotate with IB prn. Pt agrees and will arrange for pick up at the office.

## 2017-06-13 ENCOUNTER — Ambulatory Visit: Payer: Medicaid Other | Admitting: Obstetrics

## 2017-06-13 DIAGNOSIS — O8600 Infection of obstetric surgical wound, unspecified: Secondary | ICD-10-CM

## 2017-06-13 MED ORDER — AMOXICILLIN-POT CLAVULANATE 875-125 MG PO TABS: 1.0000 | ORAL_TABLET | Freq: Two times a day (BID) | ORAL | 0 refills | Status: AC

## 2017-06-13 NOTE — Progress Notes (Signed)
Subjective:     Lori Mcmahon is a 26 y.o. female who presents for a postpartum visit. She is 2 weeks postpartum following a low cervical transverse Cesarean section. I have fully reviewed the prenatal and intrapartum course. The delivery was at 39 gestational weeks. Outcome: repeat cesarean section, low transverse incision. Anesthesia: spinal. Postpartum course has been normal. Baby's course has been normal. Baby is feeding by breast. Bleeding thin lochia. Bowel function is normal. Bladder function is normal. Patient is not sexually active. Contraception method is abstinence. Postpartum depression screening: negative.  Tobacco, alcohol and substance abuse history reviewed.  Adult immunizations reviewed including TDAP, rubella and varicella.  The following portions of the patient's history were reviewed and updated as appropriate: allergies, current medications, past family history, past medical history, past social history, past surgical history and problem list.  Review of Systems A comprehensive review of systems was negative.   Objective:    BP 115/71   Pulse 68   Wt 212 lb 9.6 oz (96.4 kg)   BMI 29.65 kg/m   General:  alert and no distress   Breasts:  inspection negative, no nipple discharge or bleeding, no masses or nodularity palpable  Lungs: clear to auscultation bilaterally  Heart:  regular rate and rhythm, S1, S2 normal, no murmur, click, rub or gallop  Abdomen: normal findings: soft, non-tender and incision draining slightly right corner but closed    50% of 15 min visit spent on counseling and coordination of care.   Assessment:    2 weeks postpartum.  Doing well.  Slight incisional drainage noted but could not express any further drainage and  could   not probe any opening.  Plan:    1. Contraception: abstinence for now.  Considering options. 2. Continue PNV's 3. Follow up in: 2 weeks or as needed.

## 2017-06-13 NOTE — Progress Notes (Signed)
Patient is in the office for incision check.

## 2017-06-16 ENCOUNTER — Encounter: Payer: Self-pay | Admitting: Obstetrics

## 2017-06-16 ENCOUNTER — Telehealth: Payer: Self-pay | Admitting: *Deleted

## 2017-06-16 NOTE — Telephone Encounter (Signed)
Pt called to office today stating that her strips have come off her incision, antibiotics she was given do not seem to be helping and that she is in severe pain. Pt would like to know if needs appt?  Attempt to contact pt. Pt is not home per mother. Spoke with pt mother.  Mother states that her daughters incision is still draining and is now open. Advised her that once pt is home [facility].  Mother states understanding and states she plan to take her once pt is home.

## 2017-06-19 ENCOUNTER — Ambulatory Visit: Payer: Medicaid Other | Admitting: Advanced Practice Midwife

## 2017-06-19 ENCOUNTER — Ambulatory Visit: Payer: Medicaid Other | Admitting: Obstetrics & Gynecology

## 2017-07-02 ENCOUNTER — Ambulatory Visit: Payer: Medicaid Other | Admitting: Obstetrics & Gynecology

## 2018-07-31 IMAGING — US US MFM OB COMP +14 WKS
1 series · 14 of 28 positions shown · non-contrast
Comparison: none

[Series 1: us mfm ob comp +14 wks · 14 of 65 slices shown]
[im 3/65]
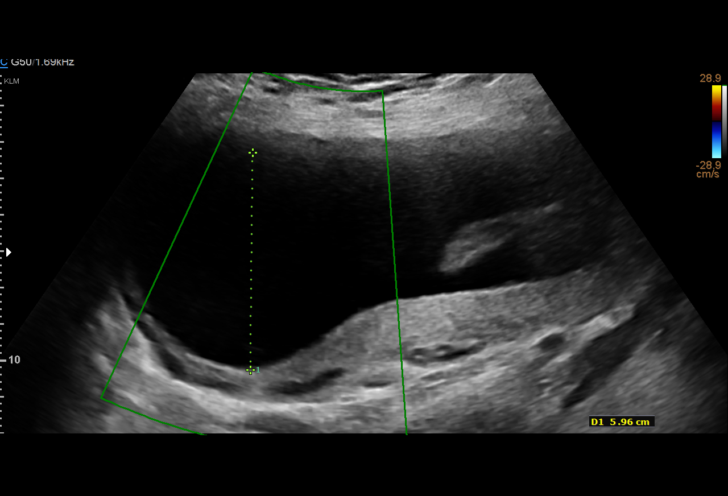
[im 8/65]
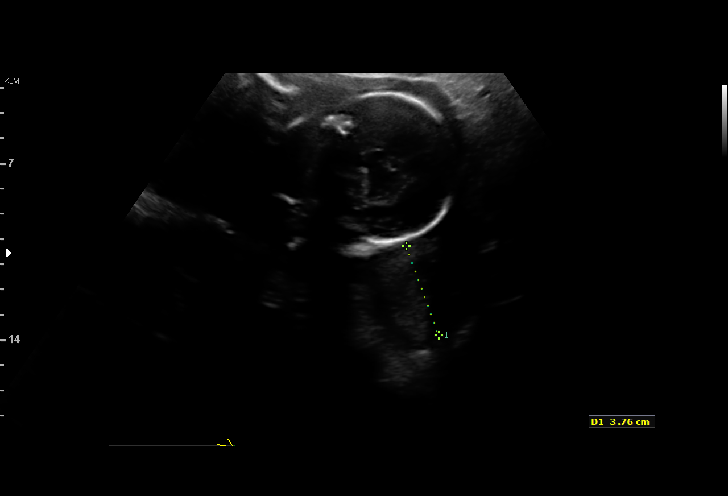
[im 12/65]
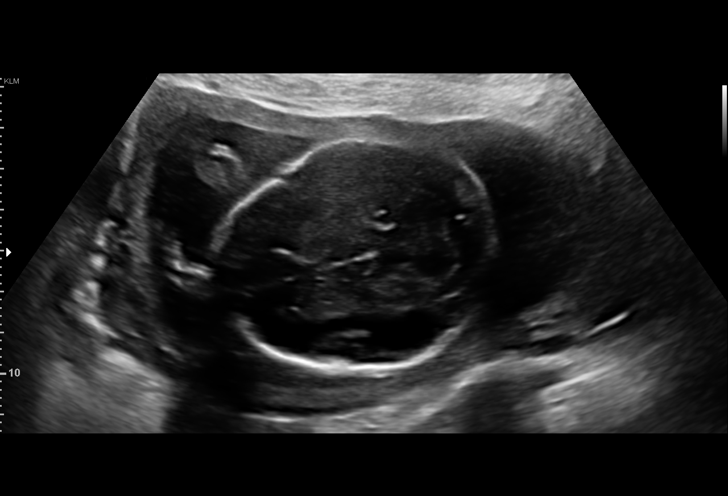
[im 17/65]
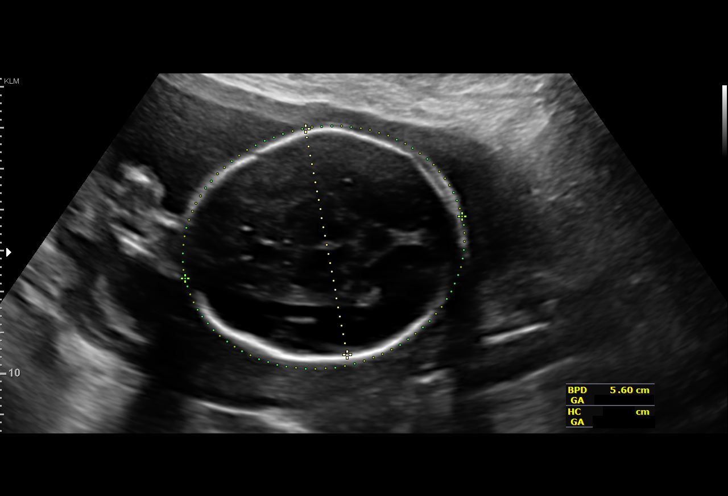
[im 22/65]
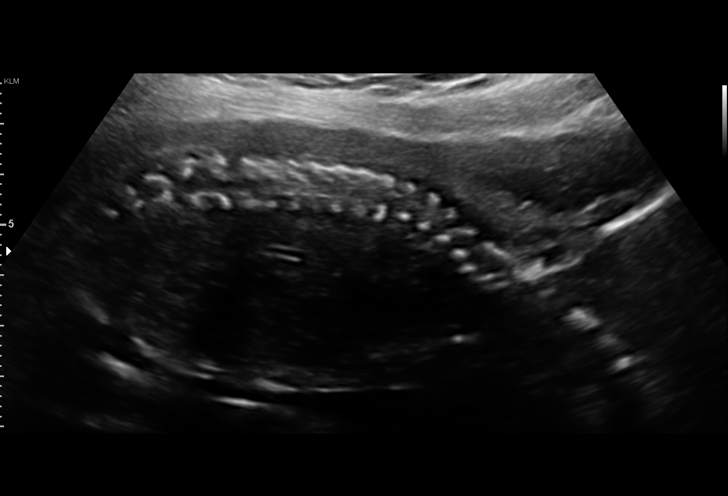
[im 27/65]
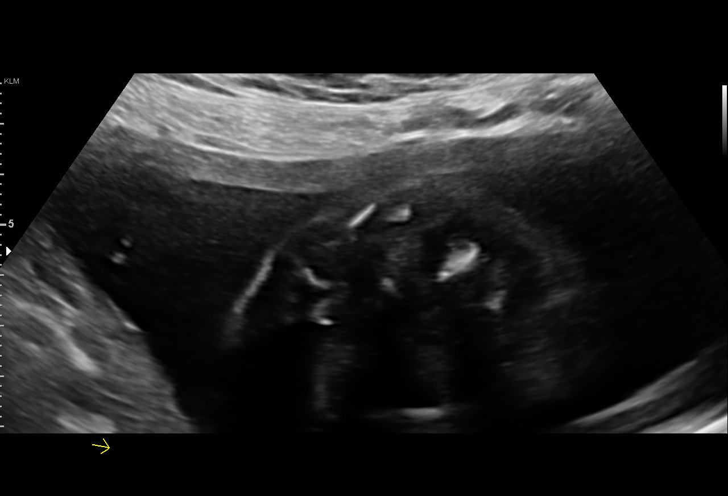
[im 31/65]
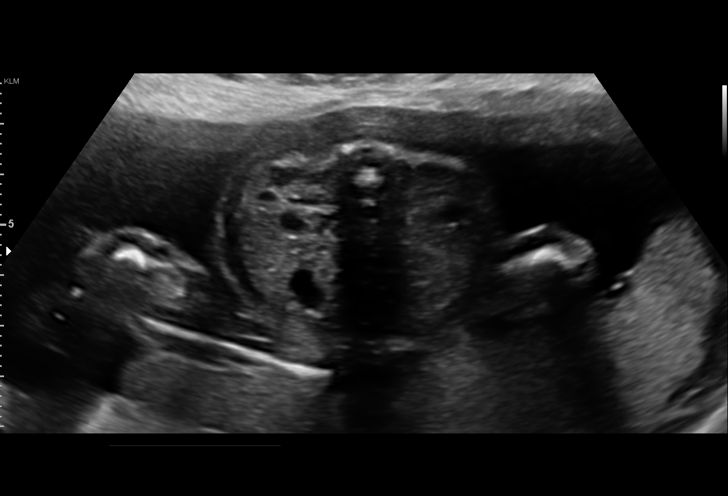
[im 36/65]
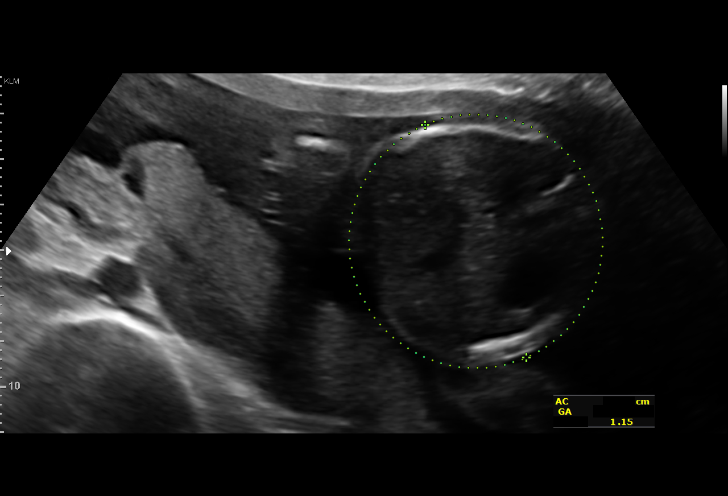
[im 41/65]
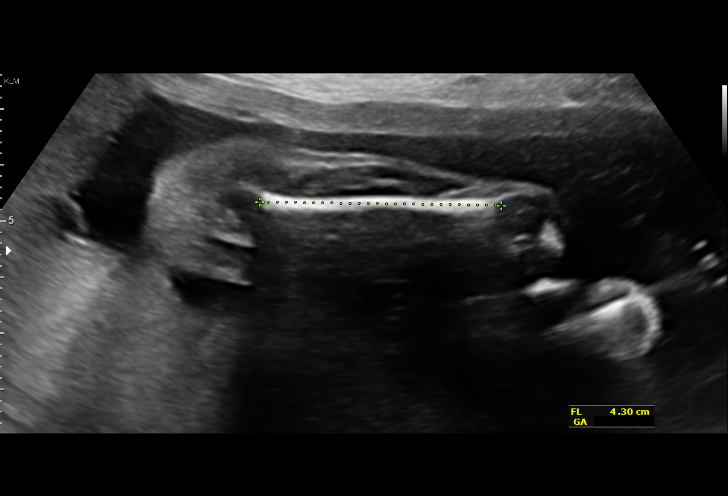
[im 46/65]
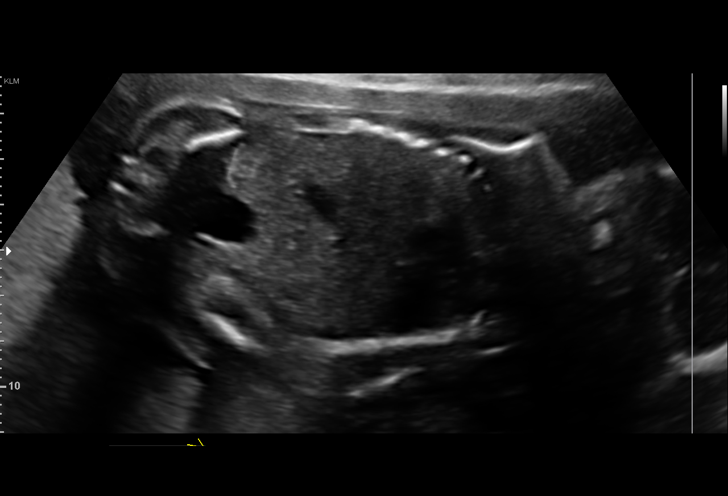
[im 50/65]
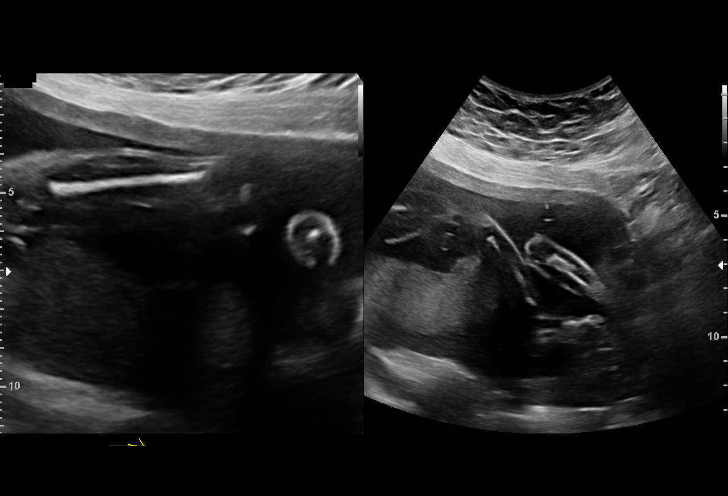
[im 55/65]
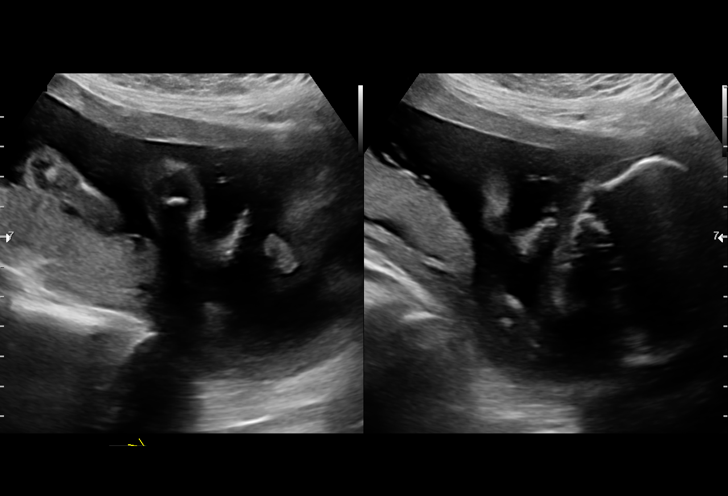
[im 60/65]
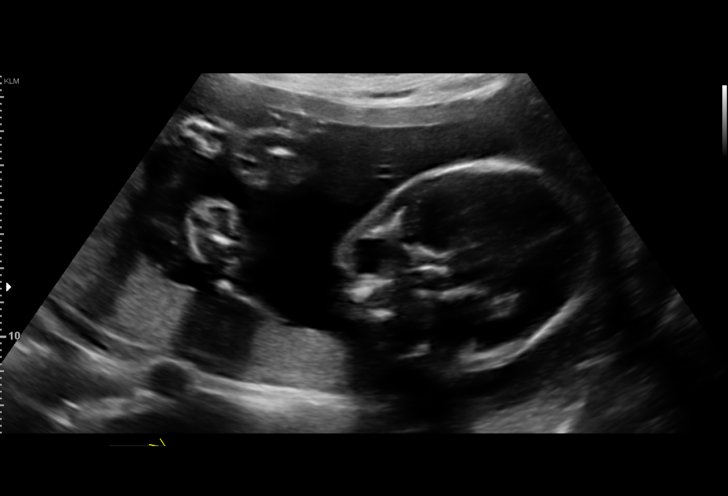
[im 65/65]
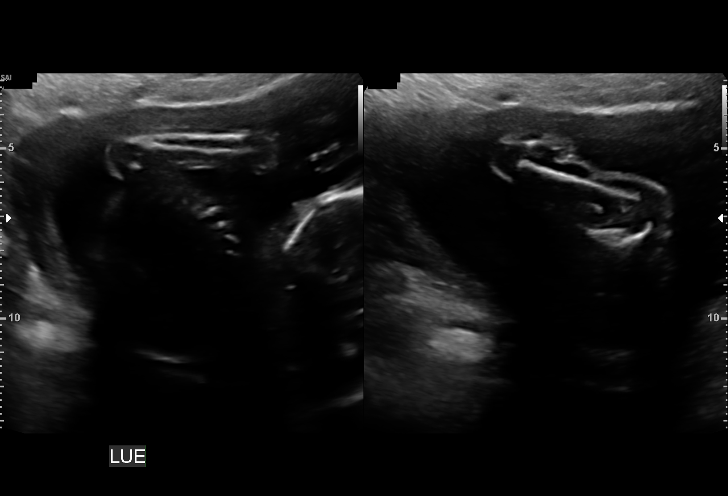

[14 of 28 positions shown; findings below may reference images not displayed]

Road [HOSPITAL]

Indications

22 weeks gestation of pregnancy
Poor obstetric history: Previous preterm
delivery, antepartum (33 weeks)
Poor obstetric history: Previous
preeclampsia / eclampsia/gestational HTN
Encounter for antenatal screening for
malformations
Encounter for uncertain dates
OB History

Gravidity:    3         Term:   2        Prem:   1        SAB:   0
TOP:          0       Ectopic:  0        Living: 2
Fetal Evaluation

Num Of Fetuses:     1
Fetal Heart         136
Rate(bpm):
Cardiac Activity:   Observed
Presentation:       Cephalic
Placenta:           Posterior, above cervical os
P. Cord Insertion:  Visualized

Amniotic Fluid
AFI FV:      Subjectively within normal limits
Biometry

BPD:      56.3  mm     G. Age:  23w 1d         59  %    CI:        77.73   %    70 - 86
FL/HC:      20.8   %    19.2 -
HC:      202.1  mm     G. Age:  22w 2d         19  %    HC/AC:      1.15        1.05 -
AC:      175.4  mm     G. Age:  22w 3d         29  %    FL/BPD:     74.6   %    71 - 87
FL:         42  mm     G. Age:  23w 5d         66  %    FL/AC:      23.9   %    20 - 24
HUM:      37.8  mm     G. Age:  23w 2d         53  %
CER:      24.6  mm     G. Age:  22w 4d         46  %

Est. FW:     549  gm      1 lb 3 oz     54  %
Gestational Age

LMP:           23w 4d        Date:  08/30/16                 EDD:   06/06/17
U/S Today:     22w 6d                                        EDD:   06/11/17
Best:          22w 6d     Det. By:  U/S (02/11/17)           EDD:   06/11/17
Anatomy

Cranium:               Appears normal         Aortic Arch:            Appears normal
Cavum:                 Appears normal         Ductal Arch:            Appears normal
Ventricles:            Appears normal         Diaphragm:              Appears normal
Choroid Plexus:        Appears normal         Stomach:                Appears normal, left
sided
Cerebellum:            Appears normal         Abdomen:                Appears normal
Posterior Fossa:       Appears normal         Abdominal Wall:         Not well visualized
Nuchal Fold:           Appears normal         Cord Vessels:           Appears normal (3
vessel cord)
Face:                  Orbits nl; profile not Kidneys:                Appear normal
well visualized
Lips:                  Not well visualized    Bladder:                Appears normal
Thoracic:              Appears normal         Spine:                  Appears normal
Heart:                 Appears normal         Upper Extremities:      Visualized
(4CH, axis, and situs
RVOT:                  Not well visualized    Lower Extremities:      Visualized
LVOT:                  Not well visualized

Other:  Fetus appears to be a female. Technically difficult due to fetal position.
Cervix Uterus Adnexa

Cervix
Length:            3.7  cm.
Normal appearance by transabdominal scan.
Impression

Singleton intrauterine pregnancy at 22+6 weeks. Uncertain
dates, history or 33 week preterm delivery
Review of the anatomy shows no sonographic markers for
aneuploidy or structural anomalies
However, fetal face and cardiac evaluations should be
considered suboptimal secondary to fetal position
Amniotic fluid volume is normal
Estimated fetal weight is 549g which is growth in the 54th
percentile
Cervical length is 37mm with no funnelling
Recommendations

Given pretern delivery, would consider repeat scan of cervix
in 2-3 weeks as well as complete anatomic survey
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
## 2022-07-20 DIAGNOSIS — N200 Calculus of kidney: Secondary | ICD-10-CM | POA: Diagnosis not present

## 2022-07-20 DIAGNOSIS — I7090 Unspecified atherosclerosis: Secondary | ICD-10-CM | POA: Diagnosis not present

## 2022-07-20 DIAGNOSIS — R300 Dysuria: Secondary | ICD-10-CM | POA: Diagnosis not present

## 2022-07-20 DIAGNOSIS — M545 Low back pain, unspecified: Secondary | ICD-10-CM | POA: Diagnosis not present

## 2022-07-20 DIAGNOSIS — N12 Tubulo-interstitial nephritis, not specified as acute or chronic: Secondary | ICD-10-CM | POA: Diagnosis not present

## 2022-07-20 DIAGNOSIS — R9341 Abnormal radiologic findings on diagnostic imaging of renal pelvis, ureter, or bladder: Secondary | ICD-10-CM | POA: Diagnosis not present
